# Patient Record
Sex: Female | Born: 1995 | Race: White | Hispanic: No | Marital: Single | Smoking: Current every day smoker
Health system: Southern US, Community
[De-identification: ages and names within clinical notes are randomized; demographics above are authoritative.]

## PROBLEM LIST (undated history)

## (undated) DIAGNOSIS — R519 Headache, unspecified: Secondary | ICD-10-CM

## (undated) DIAGNOSIS — Z789 Other specified health status: Secondary | ICD-10-CM

## (undated) HISTORY — DX: Headache, unspecified: R51.9

## (undated) HISTORY — PX: NO PAST SURGERIES: SHX2092

## (undated) HISTORY — DX: Other specified health status: Z78.9

---

## 2015-08-12 ENCOUNTER — Emergency Department: Payer: Medicaid Other

## 2015-08-12 ENCOUNTER — Encounter: Payer: Self-pay | Admitting: Emergency Medicine

## 2015-08-12 ENCOUNTER — Emergency Department
Admission: EM | Admit: 2015-08-12 | Discharge: 2015-08-12 | Disposition: A | Discharge status: Home / Self Care | Payer: Medicaid Other | Attending: Emergency Medicine | Admitting: Emergency Medicine

## 2015-08-12 DIAGNOSIS — R1010 Upper abdominal pain, unspecified: Secondary | ICD-10-CM | POA: Diagnosis present

## 2015-08-12 DIAGNOSIS — R1012 Left upper quadrant pain: Secondary | ICD-10-CM | POA: Insufficient documentation

## 2015-08-12 DIAGNOSIS — Z3202 Encounter for pregnancy test, result negative: Secondary | ICD-10-CM | POA: Diagnosis not present

## 2015-08-12 DIAGNOSIS — Z720 Tobacco use: Secondary | ICD-10-CM | POA: Diagnosis not present

## 2015-08-12 DIAGNOSIS — R1011 Right upper quadrant pain: Secondary | ICD-10-CM | POA: Insufficient documentation

## 2015-08-12 LAB — CBC
HEMATOCRIT: 34.6 % — AB (ref 35.0–47.0)
Hemoglobin: 11.7 g/dL — ABNORMAL LOW (ref 12.0–16.0)
MCH: 30.7 pg (ref 26.0–34.0)
MCHC: 33.7 g/dL (ref 32.0–36.0)
MCV: 91 fL (ref 80.0–100.0)
Platelets: 348 10*3/uL (ref 150–440)
RBC: 3.81 MIL/uL (ref 3.80–5.20)
RDW: 12.8 % (ref 11.5–14.5)
WBC: 12.2 10*3/uL — ABNORMAL HIGH (ref 3.6–11.0)

## 2015-08-12 LAB — COMPREHENSIVE METABOLIC PANEL
ALBUMIN: 3.2 g/dL — AB (ref 3.5–5.0)
ALK PHOS: 88 U/L (ref 38–126)
ALT: 12 U/L — ABNORMAL LOW (ref 14–54)
AST: 17 U/L (ref 15–41)
Anion gap: 8 (ref 5–15)
BILIRUBIN TOTAL: 0.3 mg/dL (ref 0.3–1.2)
BUN: 9 mg/dL (ref 6–20)
CALCIUM: 8.5 mg/dL — AB (ref 8.9–10.3)
CO2: 29 mmol/L (ref 22–32)
CREATININE: 0.69 mg/dL (ref 0.44–1.00)
Chloride: 101 mmol/L (ref 101–111)
GFR calc Af Amer: >60 mL/min (ref >60)
GFR calc non Af Amer: >60 mL/min (ref >60)
GLUCOSE: 97 mg/dL (ref 65–99)
Potassium: 3.9 mmol/L (ref 3.5–5.1)
Sodium: 138 mmol/L (ref 135–145)
TOTAL PROTEIN: 7 g/dL (ref 6.5–8.1)

## 2015-08-12 LAB — URINALYSIS COMPLETE WITH MICROSCOPIC (ARMC ONLY)
BACTERIA UA: NONE SEEN
BILIRUBIN URINE: NEGATIVE
GLUCOSE, UA: NEGATIVE mg/dL
Ketones, ur: NEGATIVE mg/dL
NITRITE: NEGATIVE
Protein, ur: NEGATIVE mg/dL
Specific Gravity, Urine: 1.017 (ref 1.005–1.030)
pH: 7 (ref 5.0–8.0)

## 2015-08-12 LAB — POCT PREGNANCY, URINE: Preg Test, Ur: NEGATIVE

## 2015-08-12 LAB — LIPASE, BLOOD: Lipase: 19 U/L — ABNORMAL LOW (ref 22–51)

## 2015-08-12 MED ORDER — IBUPROFEN 400 MG PO TABS
600.0000 mg | ORAL_TABLET | Freq: Once | ORAL | Status: AC
  Administered 2015-08-12: 600 mg via ORAL
  Filled 2015-08-12: qty 2

## 2015-08-12 MED ORDER — IBUPROFEN 600 MG PO TABS: 600.0000 mg | ORAL_TABLET | Freq: Three times a day (TID) | ORAL | Status: DC | PRN

## 2015-08-12 MED ORDER — DICYCLOMINE HCL 10 MG PO CAPS
10.0000 mg | ORAL_CAPSULE | Freq: Once | ORAL | Status: AC
  Administered 2015-08-12: 10 mg via ORAL
  Filled 2015-08-12 (×2): qty 1

## 2015-08-12 NOTE — ED Notes (Signed)
Patient transported to Ultrasound 

## 2015-08-12 NOTE — ED Provider Notes (Signed)
[facility] Emergency Department Provider Note  ____________________________________________  Time seen: 1820  I have reviewed the triage vital signs and the nursing notes.   HISTORY  Chief Complaint Abdominal Pain   HPI Sabrina Edwards is a 19 y.o. female who presents complaining of upper abdominal pain. This is been present for 2-3 days. Initially it was mild and not consistent, but now it is more uncomfortable and steady. She denies any nausea, vomiting, or diarrhea. She had expressed to the nurse's upfront that this is primarily in the right upper quadrant. On our interview, she points more to her left upper abdomen. It hurts when she attempts to stand straight up. She has not had symptoms like this before. She has not taken any medication to try to help this improve. She has no known health problems and does not take any medications. She denies any fever. She denies any injury or recent strain.   History reviewed. No pertinent past medical history.  There are no active problems to display for this patient.   History reviewed. No pertinent past surgical history.  Current Outpatient Rx  Name  Route  Sig  Dispense  Refill  . ibuprofen (ADVIL,MOTRIN) 600 MG tablet   Oral   Take 1 tablet (600 mg total) by mouth every 8 (eight) hours as needed for moderate pain.   24 tablet   0     Allergies Review of patient's allergies indicates no known allergies.  No family history on file.  Social History Social History  Substance Use Topics  . Smoking status: Current Every Day Smoker  . Smokeless tobacco: None  . Alcohol Use: No    Review of Systems  Constitutional: Negative for fever. ENT: Negative for sore throat. Cardiovascular: Negative for chest pain. Respiratory: Negative for shortness of breath. Gastrointestinal: Positive for upper abdominal pain. Negative for nausea, vomiting, or diarrhea. See history of present illness Genitourinary: Negative  for dysuria. Musculoskeletal: No myalgias or injuries. Skin: Negative for rash. Neurological: Negative for headaches   10-point ROS otherwise negative.  ____________________________________________   PHYSICAL EXAM:  VITAL SIGNS: ED Triage Vitals  Enc Vitals Group     BP 08/12/15 1723 117/62 mmHg     Pulse Rate 08/12/15 1723 88     Resp 08/12/15 1723 16     Temp 08/12/15 1723 98.4 F (36.9 C)     Temp Source 08/12/15 1723 Oral     SpO2 08/12/15 1723 100 %     Weight 08/12/15 1723 125 lb (56.7 kg)     Height 08/12/15 1723  (1.753 m)     Head Cir --      Peak Flow --      Pain Score 08/12/15 1736 9     Pain Loc --      Pain Edu? --      Excl. in GC? --     Constitutional:  Alert and oriented. Well appearing and in no distress. ENT   Head: Normocephalic and atraumatic.   Nose: No congestion/rhinnorhea.   Mouth/Throat: Mucous membranes are moist. Cardiovascular: Normal rate, regular rhythm, no murmur noted Respiratory:  Normal respiratory effort, no tachypnea.    Breath sounds are clear and equal bilaterally.  Gastrointestinal: Soft, muscular, tenderness more notable in left upper quadrant, mild in right upper quadrant. No discomfort in the lower abdomen. No distention.  Back: No muscle spasm, no CVA tenderness. There is an area of focal tenderness in the right lower posterior thoracic area. Musculoskeletal:  Patient appears a bit uncomfortable when attempting to stand up straight. She has discomfort with movement in general. She has tenderness, as noted above the back exam in her right posterior thoracic area. Her extremities all appear normal with full range of motion and no deformity..  No noted edema. Neurologic:  Normal speech and language. No gross focal neurologic deficits are appreciated.  Skin:  Skin is warm, dry. No rash noted. Psychiatric: Mood and affect are normal. Speech and behavior are normal.  ____________________________________________     LABS (pertinent positives/negatives)  Labs Reviewed  LIPASE, BLOOD - Abnormal; Notable for the following:    Lipase 19 (*)    All other components within normal limits  COMPREHENSIVE METABOLIC PANEL - Abnormal; Notable for the following:    Calcium 8.5 (*)    Albumin 3.2 (*)    ALT 12 (*)    All other components within normal limits  CBC - Abnormal; Notable for the following:    WBC 12.2 (*)    Hemoglobin 11.7 (*)    HCT 34.6 (*)    All other components within normal limits  URINALYSIS COMPLETEWITH MICROSCOPIC ([facility] ONLY) - Abnormal; Notable for the following:    Color, Urine YELLOW (*)    APPearance CLOUDY (*)    Hgb urine dipstick 1+ (*)    Leukocytes, UA 2+ (*)    Squamous Epithelial / LPF 6-30 (*)    All other components within normal limits  POCT PREGNANCY, URINE  POC URINE PREG, ED   ____________________________________________   RADIOLOGY  Ultrasound abdomen: IMPRESSION: Normal right upper quadrant abdominal ultrasound.  KUB  IMPRESSION: Minimally prominent stool in colon.  ____________________________________________  INITIAL IMPRESSION / ASSESSMENT AND PLAN / ED COURSE  Pertinent labs & imaging results that were available during my care of the patient were reviewed by me and considered in my medical decision making (see chart for details).  Well-nourished, well-developed, 19 year old female with upper abdominal pain. The nurse's note mentioned right upper quadrant, but on exam, her left upper quadrant appeared more tender. Despite this, when she stands up, she has increased discomfort and appears to be guarding the right upper quadrant of her abdomen more. She does have some focal tenderness in the right posterior thoracic area area there is a good chance that this discomfort is primarily musculoskeletal. She denies any new exercise activity or strain. We will obtain an ultrasound due to the concerns for upper abdominal pain, right upper quadrant, and  possible biliary disease, although I have low suspicion. We will also obtain a KUB. We've discussed the pros and cons of CT. At this point I do not see an indication for this. We will treat her with Bentyl and ibuprofen for her discomfort.  ----------------------------------------- 8:11 PM on 08/12/2015 -----------------------------------------  Ultrasound is negative. KUB is unremarkable with only minimally prominent stool in colon. Urinalysis does show cloudy urine with 6-30 white blood cells and 2+ leukocyte esterase. She has 6-30 squamous epithelial cells. This is questionable for urinary tract infection. I do not feel a urinary tract infection would explain her symptoms of upper abdominal pain.  Reassessment of the patient at this time finds her more comfortable and when she first arrived. She feels more comfortable sitting up and moving. She is in no acute distress. She is smiling and laughing. I believe this improvement is more likely due to the ibuprofen and the Bentyl she received. I will advise her to continue to take ibuprofen, 600 mg, 3 times a day,  for the next 2 days, and then as needed.  ____________________________________________   FINAL CLINICAL IMPRESSION(S) / ED DIAGNOSES  Final diagnoses:  Pain of upper abdomen      Darien Ramus, MD 08/12/15 2020

## 2015-08-12 NOTE — ED Notes (Signed)
Patient to ER for RUQ abdominal pain for few days. Denies N/V or fevers.

## 2015-08-12 NOTE — Discharge Instructions (Signed)
It is unclear what is causing her upper abdominal pain. We discussed gallbladder, intestines, musculoskeletal, and other causes. Given the improvement that you've had an overall well appearance, we suspect musculoskeletal primarily at this point. You had a negative ultrasound, negative x-ray of her abdomen, an overall negative blood tests. Take ibuprofen, 600 mg, 3 times a day for 2 days, then as needed. Return to the emergency department if your pain worsens or if you have other urgent concerns. Follow-up with your regular doctor or cannot a clinic or another doctor for further evaluation if you have ongoing problems.  Abdominal Pain Many things can cause belly (abdominal) pain. Most times, the belly pain is not dangerous. Many cases of belly pain can be watched and treated at home. HOME CARE   Do not take medicines that help you go poop (laxatives) unless told to by your doctor.  Only take medicine as told by your doctor.  Eat or drink as told by your doctor. Your doctor will tell you if you should be on a special diet. GET HELP IF:  You do not know what is causing your belly pain.  You have belly pain while you are sick to your stomach (nauseous) or have runny poop (diarrhea).  You have pain while you pee or poop.  Your belly pain wakes you up at night.  You have belly pain that gets worse or better when you eat.  You have belly pain that gets worse when you eat fatty foods.  You have a fever. GET HELP RIGHT AWAY IF:   The pain does not go away within 2 hours.  You keep throwing up (vomiting).  The pain changes and is only in the right or left part of the belly.  You have bloody or tarry looking poop. MAKE SURE YOU:   Understand these instructions.  Will watch your condition.  Will get help right away if you are not doing well or get worse. Document Released: 04/27/2008 Document Revised: 11/14/2013 Document Reviewed: 07/19/2013 [facility] Patient Information 2015  [place], [place]. This information is not intended to replace advice given to you by your health care provider. Make sure you discuss any questions you have with your health care provider.

## 2015-10-14 ENCOUNTER — Encounter: Payer: Self-pay | Admitting: Medical Oncology

## 2015-10-14 ENCOUNTER — Emergency Department
Admission: EM | Admit: 2015-10-14 | Discharge: 2015-10-14 | Disposition: A | Discharge status: Home / Self Care | Payer: Medicaid Other | Attending: Emergency Medicine | Admitting: Emergency Medicine

## 2015-10-14 ENCOUNTER — Emergency Department: Payer: Medicaid Other

## 2015-10-14 DIAGNOSIS — Y92410 Unspecified street and highway as the place of occurrence of the external cause: Secondary | ICD-10-CM | POA: Diagnosis not present

## 2015-10-14 DIAGNOSIS — Y9389 Activity, other specified: Secondary | ICD-10-CM | POA: Diagnosis not present

## 2015-10-14 DIAGNOSIS — Y998 Other external cause status: Secondary | ICD-10-CM | POA: Diagnosis not present

## 2015-10-14 DIAGNOSIS — S39012A Strain of muscle, fascia and tendon of lower back, initial encounter: Secondary | ICD-10-CM | POA: Diagnosis not present

## 2015-10-14 DIAGNOSIS — Z3202 Encounter for pregnancy test, result negative: Secondary | ICD-10-CM | POA: Insufficient documentation

## 2015-10-14 DIAGNOSIS — F17200 Nicotine dependence, unspecified, uncomplicated: Secondary | ICD-10-CM | POA: Insufficient documentation

## 2015-10-14 DIAGNOSIS — S3992XA Unspecified injury of lower back, initial encounter: Secondary | ICD-10-CM | POA: Diagnosis present

## 2015-10-14 LAB — POCT PREGNANCY, URINE: PREG TEST UR: NEGATIVE

## 2015-10-14 MED ORDER — IBUPROFEN 800 MG PO TABS
800.0000 mg | ORAL_TABLET | Freq: Once | ORAL | Status: AC
  Administered 2015-10-14: 800 mg via ORAL
  Filled 2015-10-14: qty 1

## 2015-10-14 MED ORDER — DIAZEPAM 5 MG PO TABS: 5.0000 mg | ORAL_TABLET | Freq: Three times a day (TID) | ORAL | Status: DC | PRN

## 2015-10-14 MED ORDER — IBUPROFEN 800 MG PO TABS: 800.0000 mg | ORAL_TABLET | Freq: Three times a day (TID) | ORAL | Status: DC | PRN
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
## 2015-10-14 NOTE — ED Notes (Signed)
Pt informed to return if any life threatening symptoms occur.  

## 2015-10-14 NOTE — Discharge Instructions (Signed)
Back Exercises °The following exercises strengthen the muscles that help to support the back. They also help to keep the lower back flexible. Doing these exercises can help to prevent back pain or lessen existing pain. °If you have back pain or discomfort, try doing these exercises 2-3 times each day or as told by your health care provider. When the pain goes away, do them once each day, but increase the number of times that you repeat the steps for each exercise (do more repetitions). If you do not have back pain or discomfort, do these exercises once each day or as told by your health care provider. °EXERCISES °Single Knee to Chest °Repeat these steps 3-5 times for each leg: °· Lie on your back on a firm bed or the floor with your legs extended. °· Bring one knee to your chest. Your other leg should stay extended and in contact with the floor. °· Hold your knee in place by grabbing your knee or thigh. °· Pull on your knee until you feel a gentle stretch in your lower back. °· Hold the stretch for 10-30 seconds. °· Slowly release and straighten your leg. °Pelvic Tilt °Repeat these steps 5-10 times: °· Lie on your back on a firm bed or the floor with your legs extended. °· Bend your knees so they are pointing toward the ceiling and your feet are flat on the floor. °· Tighten your lower abdominal muscles to press your lower back against the floor. This motion will tilt your pelvis so your tailbone points up toward the ceiling instead of pointing to your feet or the floor. °· With gentle tension and even breathing, hold this position for 5-10 seconds. °Cat-Cow °Repeat these steps until your lower back becomes more flexible: °· Get into a hands-and-knees position on a firm surface. Keep your hands under your shoulders, and keep your knees under your hips. You may place padding under your knees for comfort. °· Let your head hang down, and point your tailbone toward the floor so your lower back becomes rounded like the  back of a cat. °· Hold this position for 5 seconds. °· Slowly lift your head and point your tailbone up toward the ceiling so your back forms a sagging arch like the back of a cow. °· Hold this position for 5 seconds. °Press-Ups °Repeat these steps 5-10 times: °· Lie on your abdomen (face-down) on the floor. °· Place your palms near your head, about shoulder-width apart. °· While you keep your back as relaxed as possible and keep your hips on the floor, slowly straighten your arms to raise the top half of your body and lift your shoulders. Do not use your back muscles to raise your upper torso. You may adjust the placement of your hands to make yourself more comfortable. °· Hold this position for 5 seconds while you keep your back relaxed. °· Slowly return to lying flat on the floor. °Bridges °Repeat these steps 10 times: °· Lie on your back on a firm surface. °· Bend your knees so they are pointing toward the ceiling and your feet are flat on the floor. °· Tighten your buttocks muscles and lift your buttocks off of the floor until your waist is at almost the same height as your knees. You should feel the muscles working in your buttocks and the back of your thighs. If you do not feel these muscles, slide your feet 1-2 inches farther away from your buttocks. °· Hold this position for 3-5   seconds.  Slowly lower your hips to the starting position, and allow your buttocks muscles to relax completely. If this exercise is too easy, try doing it with your arms crossed over your chest. Abdominal Crunches Repeat these steps 5-10 times:  Lie on your back on a firm bed or the floor with your legs extended.  Bend your knees so they are pointing toward the ceiling and your feet are flat on the floor.  Cross your arms over your chest.  Tip your chin slightly toward your chest without bending your neck.  Tighten your abdominal muscles and slowly raise your trunk (torso) high enough to lift your shoulder blades a  tiny bit off of the floor. Avoid raising your torso higher than that, because it can put too much stress on your low back and it does not help to strengthen your abdominal muscles.  Slowly return to your starting position. Back Lifts Repeat these steps 5-10 times:  Lie on your abdomen (face-down) with your arms at your sides, and rest your forehead on the floor.  Tighten the muscles in your legs and your buttocks.  Slowly lift your chest off of the floor while you keep your hips pressed to the floor. Keep the back of your head in line with the curve in your back. Your eyes should be looking at the floor.  Hold this position for 3-5 seconds.  Slowly return to your starting position. SEEK MEDICAL CARE IF:  Your back pain or discomfort gets much worse when you do an exercise.  Your back pain or discomfort does not lessen within 2 hours after you exercise. If you have any of these problems, stop doing these exercises right away. Do not do them again unless your health care provider says that you can. SEEK IMMEDIATE MEDICAL CARE IF:  You develop sudden, severe back pain. If this happens, stop doing the exercises right away. Do not do them again unless your health care provider says that you can.   This information is not intended to replace advice given to you by your health care provider. Make sure you discuss any questions you have with your health care provider.   Document Released: 12/17/2004 Document Revised: 07/31/2015 Document Reviewed: 01/03/2015 [employer] Interactive Patient Education 2016 [employer]  Low Back Sprain With Rehab A sprain is an injury in which a ligament is torn. The ligaments of the lower back are vulnerable to sprains. However, they are strong and require great force to be injured. These ligaments are important for stabilizing the spinal column. Sprains are classified into three categories. Grade 1 sprains cause pain, but the tendon is not lengthened. Grade 2  sprains include a lengthened ligament, due to the ligament being stretched or partially ruptured. With grade 2 sprains there is still function, although the function may be decreased. Grade 3 sprains involve a complete tear of the tendon or muscle, and function is usually impaired. SYMPTOMS   Severe pain in the lower back.  Sometimes, a feeling of a "pop," "snap," or tear, at the time of injury.  Tenderness and sometimes swelling at the injury site.  Uncommonly, bruising (contusion) within 48 hours of injury.  Muscle spasms in the back. CAUSES  Low back sprains occur when a force is placed on the ligaments that is greater than they can handle. Common causes of injury include:  Performing a stressful act while off-balance.  Repetitive stressful activities that involve movement of the lower back.  Direct hit (trauma) to the  lower back. RISK INCREASES WITH:  Contact sports (football, wrestling).  Collisions (major skiing accidents).  Sports that require throwing or lifting (baseball, weightlifting).  Sports involving twisting of the spine (gymnastics, diving, tennis, golf).  Poor strength and flexibility.  Inadequate protection.  Previous back injury or surgery (especially fusion). PREVENTION  Wear properly fitted and padded protective equipment.  Warm up and stretch properly before activity.  Allow for adequate recovery between workouts.  Maintain physical fitness:  Strength, flexibility, and endurance.  Cardiovascular fitness.  Maintain a healthy body weight. PROGNOSIS  If treated properly, low back sprains usually heal with non-surgical treatment. The length of time for healing depends on the severity of the injury.  RELATED COMPLICATIONS   Recurring symptoms, resulting in a chronic problem.  Chronic inflammation and pain in the low back.  Delayed healing or resolution of symptoms, especially if activity is resumed too soon.  Prolonged  impairment.  Unstable or arthritic joints of the low back. TREATMENT  Treatment first involves the use of ice and medicine, to reduce pain and inflammation. The use of strengthening and stretching exercises may help reduce pain with activity. These exercises may be performed at home or with a therapist. Severe injuries may require referral to a therapist for further evaluation and treatment, such as ultrasound. Your caregiver may advise that you wear a back brace or corset, to help reduce pain and discomfort. Often, prolonged bed rest results in greater harm then benefit. Corticosteroid injections may be recommended. However, these should be reserved for the most serious cases. It is important to avoid using your back when lifting objects. At night, sleep on your back on a firm mattress, with a pillow placed under your knees. If non-surgical treatment is unsuccessful, surgery may be needed.  MEDICATION   If pain medicine is needed, nonsteroidal anti-inflammatory medicines (aspirin and ibuprofen), or other minor pain relievers (acetaminophen), are often advised.  Do not take pain medicine for 7 days before surgery.  Prescription pain relievers may be given, if your caregiver thinks they are needed. Use only as directed and only as much as you need.  Ointments applied to the skin may be helpful.  Corticosteroid injections may be given by your caregiver. These injections should be reserved for the most serious cases, because they may only be given a certain number of times. HEAT AND COLD  Cold treatment (icing) should be applied for 10 to 15 minutes every 2 to 3 hours for inflammation and pain, and immediately after activity that aggravates your symptoms. Use ice packs or an ice massage.  Heat treatment may be used before performing stretching and strengthening activities prescribed by your caregiver, physical therapist, or athletic trainer. Use a heat pack or a warm water soak. SEEK MEDICAL CARE  IF:   Symptoms get worse or do not improve in 2 to 4 weeks, despite treatment.  You develop numbness or weakness in either leg.  You lose bowel or bladder function.  Any of the following occur after surgery: fever, increased pain, swelling, redness, drainage of fluids, or bleeding in the affected area.  New, unexplained symptoms develop. (Drugs used in treatment may produce side effects.) EXERCISES  RANGE OF MOTION (ROM) AND STRETCHING EXERCISES - Low Back Sprain Most people with lower back pain will find that their symptoms get worse with excessive bending forward (flexion) or arching at the lower back (extension). The exercises that will help resolve your symptoms will focus on the opposite motion.  Your physician, physical therapist  or athletic trainer will help you determine which exercises will be most helpful to resolve your lower back pain. Do not complete any exercises without first consulting with your caregiver. Discontinue any exercises which make your symptoms worse, until you speak to your caregiver. If you have pain, numbness or tingling which travels down into your buttocks, leg or foot, the goal of the therapy is for these symptoms to move closer to your back and eventually resolve. Sometimes, these leg symptoms will get better, but your lower back pain may worsen. This is often an indication of progress in your rehabilitation. Be very alert to any changes in your symptoms and the activities in which you participated in the 24 hours prior to the change. Sharing this information with your caregiver will allow him or her to most efficiently treat your condition. These exercises may help you when beginning to rehabilitate your injury. Your symptoms may resolve with or without further involvement from your physician, physical therapist or athletic trainer. While completing these exercises, remember:   Restoring tissue flexibility helps normal motion to return to the joints. This allows  healthier, less painful movement and activity.  An effective stretch should be held for at least 30 seconds.  A stretch should never be painful. You should only feel a gentle lengthening or release in the stretched tissue. FLEXION RANGE OF MOTION AND STRETCHING EXERCISES: STRETCH - Flexion, Single Knee to Chest   Lie on a firm bed or floor with both legs extended in front of you.  Keeping one leg in contact with the floor, bring your opposite knee to your chest. Hold your leg in place by either grabbing behind your thigh or at your knee.  Pull until you feel a gentle stretch in your low back. Hold __________ seconds.  Slowly release your grasp and repeat the exercise with the opposite side. Repeat __________ times. Complete this exercise __________ times per day.  STRETCH - Flexion, Double Knee to Chest  Lie on a firm bed or floor with both legs extended in front of you.  Keeping one leg in contact with the floor, bring your opposite knee to your chest.  Tense your stomach muscles to support your back and then lift your other knee to your chest. Hold your legs in place by either grabbing behind your thighs or at your knees.  Pull both knees toward your chest until you feel a gentle stretch in your low back. Hold __________ seconds.  Tense your stomach muscles and slowly return one leg at a time to the floor. Repeat __________ times. Complete this exercise __________ times per day.  STRETCH - Low Trunk Rotation  Lie on a firm bed or floor. Keeping your legs in front of you, bend your knees so they are both pointed toward the ceiling and your feet are flat on the floor.  Extend your arms out to the side. This will stabilize your upper body by keeping your shoulders in contact with the floor.  Gently and slowly drop both knees together to one side until you feel a gentle stretch in your low back. Hold for __________ seconds.  Tense your stomach muscles to support your lower back as  you bring your knees back to the starting position. Repeat the exercise to the other side. Repeat __________ times. Complete this exercise __________ times per day  EXTENSION RANGE OF MOTION AND FLEXIBILITY EXERCISES: STRETCH - Extension, Prone on Elbows   Lie on your stomach on the floor, a bed  will be too soft. Place your palms about shoulder width apart and at the height of your head.  Place your elbows under your shoulders. If this is too painful, stack pillows under your chest.  Allow your body to relax so that your hips drop lower and make contact more completely with the floor.  Hold this position for __________ seconds.  Slowly return to lying flat on the floor. Repeat __________ times. Complete this exercise __________ times per day.  RANGE OF MOTION - Extension, Prone Press Ups  Lie on your stomach on the floor, a bed will be too soft. Place your palms about shoulder width apart and at the height of your head.  Keeping your back as relaxed as possible, slowly straighten your elbows while keeping your hips on the floor. You may adjust the placement of your hands to maximize your comfort. As you gain motion, your hands will come more underneath your shoulders.  Hold this position __________ seconds.  Slowly return to lying flat on the floor. Repeat __________ times. Complete this exercise __________ times per day.  RANGE OF MOTION- Quadruped, Neutral Spine   Assume a hands and knees position on a firm surface. Keep your hands under your shoulders and your knees under your hips. You may place padding under your knees for comfort.  Drop your head and point your tailbone toward the ground below you. This will round out your lower back like an angry cat. Hold this position for __________ seconds.  Slowly lift your head and release your tail bone so that your back sags into a large arch, like an old horse.  Hold this position for __________ seconds.  Repeat this until you feel  limber in your low back.  Now, find your "sweet spot." This will be the most comfortable position somewhere between the two previous positions. This is your neutral spine. Once you have found this position, tense your stomach muscles to support your low back.  Hold this position for __________ seconds. Repeat __________ times. Complete this exercise __________ times per day.  STRENGTHENING EXERCISES - Low Back Sprain These exercises may help you when beginning to rehabilitate your injury. These exercises should be done near your "sweet spot." This is the neutral, low-back arch, somewhere between fully rounded and fully arched, that is your least painful position. When performed in this safe range of motion, these exercises can be used for people who have either a flexion or extension based injury. These exercises may resolve your symptoms with or without further involvement from your physician, physical therapist or athletic trainer. While completing these exercises, remember:   Muscles can gain both the endurance and the strength needed for everyday activities through controlled exercises.  Complete these exercises as instructed by your physician, physical therapist or athletic trainer. Increase the resistance and repetitions only as guided.  You may experience muscle soreness or fatigue, but the pain or discomfort you are trying to eliminate should never worsen during these exercises. If this pain does worsen, stop and make certain you are following the directions exactly. If the pain is still present after adjustments, discontinue the exercise until you can discuss the trouble with your caregiver. STRENGTHENING - Deep Abdominals, Pelvic Tilt   Lie on a firm bed or floor. Keeping your legs in front of you, bend your knees so they are both pointed toward the ceiling and your feet are flat on the floor.  Tense your lower abdominal muscles to press your low back into  the floor. This motion will  rotate your pelvis so that your tail bone is scooping upwards rather than pointing at your feet or into the floor. With a gentle tension and even breathing, hold this position for __________ seconds. Repeat __________ times. Complete this exercise __________ times per day.  STRENGTHENING - Abdominals, Crunches   Lie on a firm bed or floor. Keeping your legs in front of you, bend your knees so they are both pointed toward the ceiling and your feet are flat on the floor. Cross your arms over your chest.  Slightly tip your chin down without bending your neck.  Tense your abdominals and slowly lift your trunk high enough to just clear your shoulder blades. Lifting higher can put excessive stress on the lower back and does not further strengthen your abdominal muscles.  Control your return to the starting position. Repeat __________ times. Complete this exercise __________ times per day.  STRENGTHENING - Quadruped, Opposite UE/LE Lift   Assume a hands and knees position on a firm surface. Keep your hands under your shoulders and your knees under your hips. You may place padding under your knees for comfort.  Find your neutral spine and gently tense your abdominal muscles so that you can maintain this position. Your shoulders and hips should form a rectangle that is parallel with the floor and is not twisted.  Keeping your trunk steady, lift your right hand no higher than your shoulder and then your left leg no higher than your hip. Make sure you are not holding your breath. Hold this position for __________ seconds.  Continuing to keep your abdominal muscles tense and your back steady, slowly return to your starting position. Repeat with the opposite arm and leg. Repeat __________ times. Complete this exercise __________ times per day.  STRENGTHENING - Abdominals and Quadriceps, Straight Leg Raise   Lie on a firm bed or floor with both legs extended in front of you.  Keeping one leg in contact  with the floor, bend the other knee so that your foot can rest flat on the floor.  Find your neutral spine, and tense your abdominal muscles to maintain your spinal position throughout the exercise.  Slowly lift your straight leg off the floor about 6 inches for a count of 15, making sure to not hold your breath.  Still keeping your neutral spine, slowly lower your leg all the way to the floor. Repeat this exercise with each leg __________ times. Complete this exercise __________ times per day. POSTURE AND BODY MECHANICS CONSIDERATIONS - Low Back Sprain Keeping correct posture when sitting, standing or completing your activities will reduce the stress put on different body tissues, allowing injured tissues a chance to heal and limiting painful experiences. The following are general guidelines for improved posture. Your physician or physical therapist will provide you with any instructions specific to your needs. While reading these guidelines, remember:  The exercises prescribed by your provider will help you have the flexibility and strength to maintain correct postures.  The correct posture provides the best environment for your joints to work. All of your joints have less wear and tear when properly supported by a spine with good posture. This means you will experience a healthier, less painful body.  Correct posture must be practiced with all of your activities, especially prolonged sitting and standing. Correct posture is as important when doing repetitive low-stress activities (typing) as it is when doing a single heavy-load activity (lifting). RESTING POSITIONS Consider which positions  are most painful for you when choosing a resting position. If you have pain with flexion-based activities (sitting, bending, stooping, squatting), choose a position that allows you to rest in a less flexed posture. You would want to avoid curling into a fetal position on your side. If your pain worsens with  extension-based activities (prolonged standing, working overhead), avoid resting in an extended position such as sleeping on your stomach. Most people will find more comfort when they rest with their spine in a more neutral position, neither too rounded nor too arched. Lying on a non-sagging bed on your side with a pillow between your knees, or on your back with a pillow under your knees will often provide some relief. Keep in mind, being in any one position for a prolonged period of time, no matter how correct your posture, can still lead to stiffness. PROPER SITTING POSTURE In order to minimize stress and discomfort on your spine, you must sit with correct posture. Sitting with good posture should be effortless for a healthy body. Returning to good posture is a gradual process. Many people can work toward this most comfortably by using various supports until they have the flexibility and strength to maintain this posture on their own. When sitting with proper posture, your ears will fall over your shoulders and your shoulders will fall over your hips. You should use the back of the chair to support your upper back. Your lower back will be in a neutral position, just slightly arched. You may place a small pillow or folded towel at the base of your lower back for  support.  When working at a desk, create an environment that supports good, upright posture. Without extra support, muscles tire, which leads to excessive strain on joints and other tissues. Keep these recommendations in mind: CHAIR:  A chair should be able to slide under your desk when your back makes contact with the back of the chair. This allows you to work closely.  The chair's height should allow your eyes to be level with the upper part of your monitor and your hands to be slightly lower than your elbows. BODY POSITION  Your feet should make contact with the floor. If this is not possible, use a foot rest.  Keep your ears over your  shoulders. This will reduce stress on your neck and low back. INCORRECT SITTING POSTURES  If you are feeling tired and unable to assume a healthy sitting posture, do not slouch or slump. This puts excessive strain on your back tissues, causing more damage and pain. Healthier options include:  Using more support, like a lumbar pillow.  Switching tasks to something that requires you to be upright or walking.  Talking a brief walk.  Lying down to rest in a neutral-spine position. PROLONGED STANDING WHILE SLIGHTLY LEANING FORWARD  When completing a task that requires you to lean forward while standing in one place for a long time, place either foot up on a stationary 2-4 inch high object to help maintain the best posture. When both feet are on the ground, the lower back tends to lose its slight inward curve. If this curve flattens (or becomes too large), then the back and your other joints will experience too much stress, tire more quickly, and can cause pain. CORRECT STANDING POSTURES Proper standing posture should be assumed with all daily activities, even if they only take a few moments, like when brushing your teeth. As in sitting, your ears should fall over your  shoulders and your shoulders should fall over your hips. You should keep a slight tension in your abdominal muscles to brace your spine. Your tailbone should point down to the ground, not behind your body, resulting in an over-extended swayback posture.  INCORRECT STANDING POSTURES  Common incorrect standing postures include a forward head, locked knees and/or an excessive swayback. WALKING Walk with an upright posture. Your ears, shoulders and hips should all line-up. PROLONGED ACTIVITY IN A FLEXED POSITION When completing a task that requires you to bend forward at your waist or lean over a low surface, try to find a way to stabilize 3 out of 4 of your limbs. You can place a hand or elbow on your thigh or rest a knee on the surface you  are reaching across. This will provide you more stability, so that your muscles do not tire as quickly. By keeping your knees relaxed, or slightly bent, you will also reduce stress across your lower back. CORRECT LIFTING TECHNIQUES DO :  Assume a wide stance. This will provide you more stability and the opportunity to get as close as possible to the object which you are lifting.  Tense your abdominals to brace your spine. Bend at the knees and hips. Keeping your back locked in a neutral-spine position, lift using your leg muscles. Lift with your legs, keeping your back straight.  Test the weight of unknown objects before attempting to lift them.  Try to keep your elbows locked down at your sides in order get the best strength from your shoulders when carrying an object.  Always ask for help when lifting heavy or awkward objects. INCORRECT LIFTING TECHNIQUES DO NOT:   Lock your knees when lifting, even if it is a small object.  Bend and twist. Pivot at your feet or move your feet when needing to change directions.  Assume that you can safely pick up even a paperclip without proper posture.   This information is not intended to replace advice given to you by your health care provider. Make sure you discuss any questions you have with your health care provider.   Document Released: 11/09/2005 Document Revised: 11/30/2014 Document Reviewed: 02/21/2009 Elsevier Interactive Patient Education [employer]
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
## 2017-08-17 IMAGING — US US ABDOMEN LIMITED
1 series · 14 of 25 positions shown · non-contrast
Comparison: None.

CLINICAL DATA: 18-year-old female with right upper abdominal pain
for 2 days.

EXAM:
US ABDOMEN LIMITED - RIGHT UPPER QUADRANT

[Series 1: us abdomen limited · 0.16mm/px · 14 of 43 slices shown]
[im 1/43]
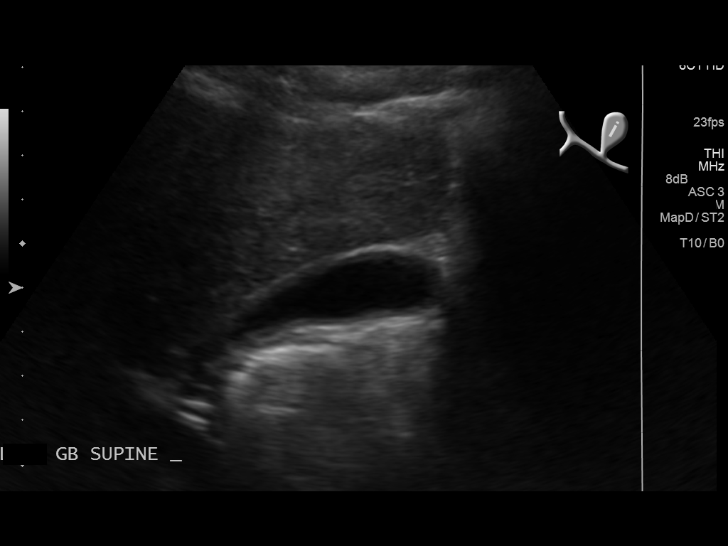
[im 4/43]
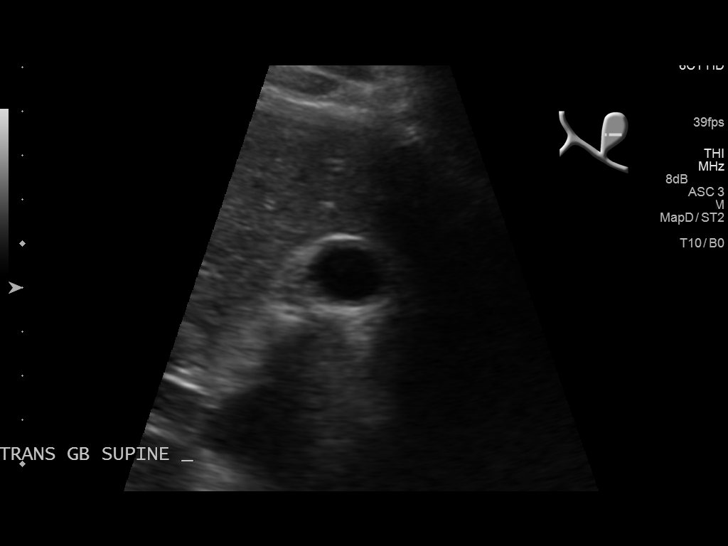
[im 8/43]
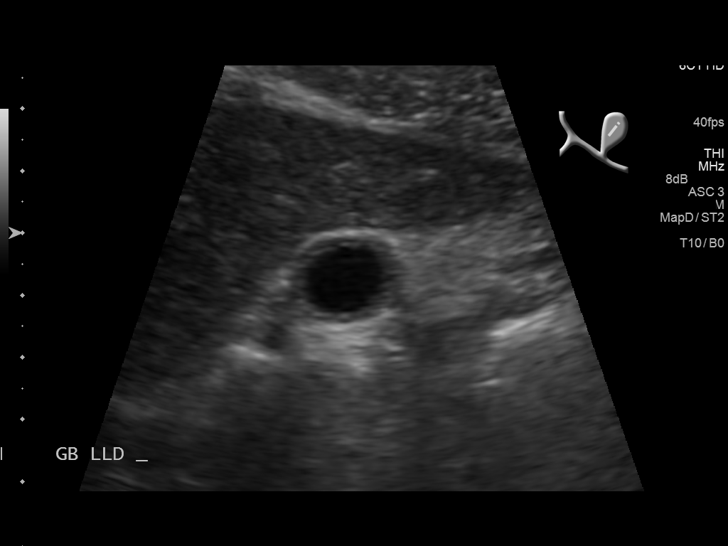
[im 11/43]
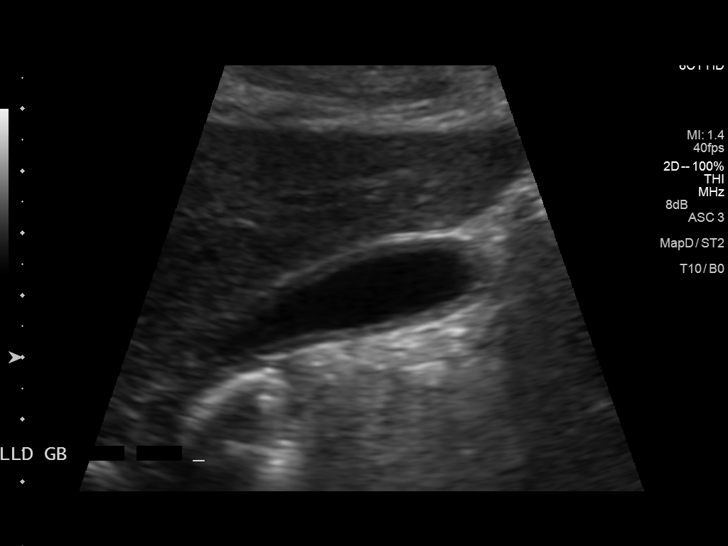
[im 15/43]
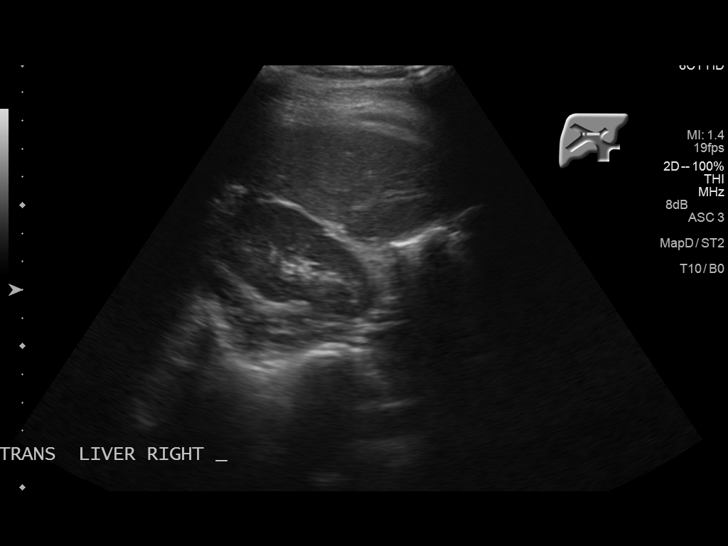
[im 16/43]
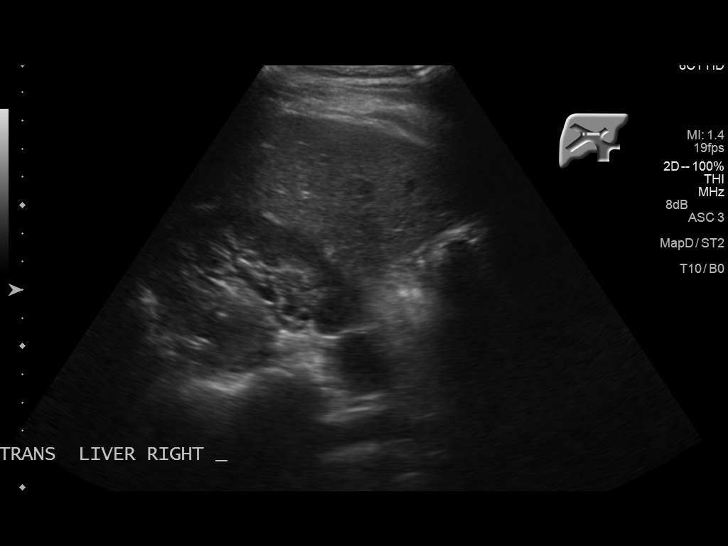
[im 20/43]
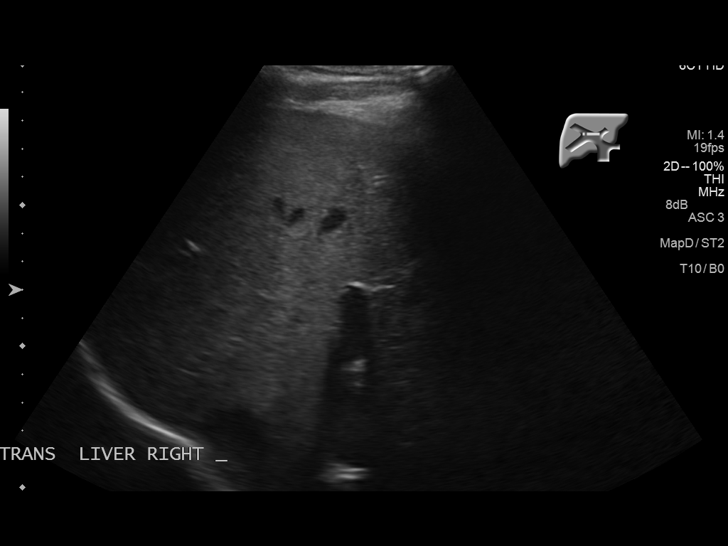
[im 23/43]
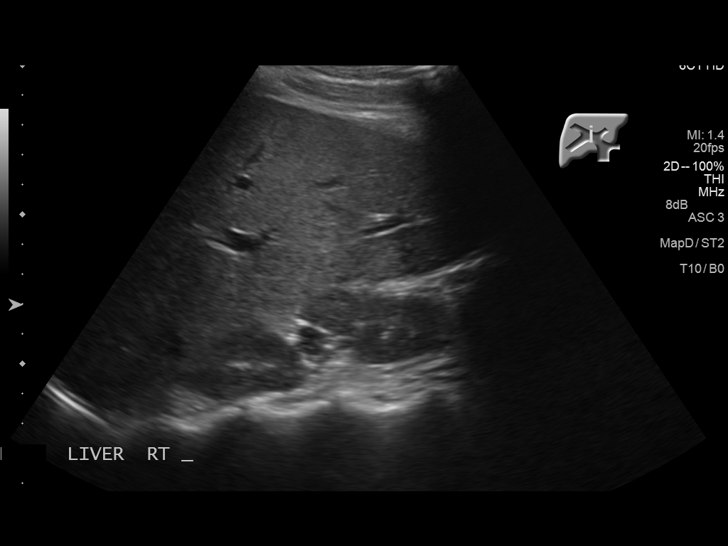
[im 27/43]
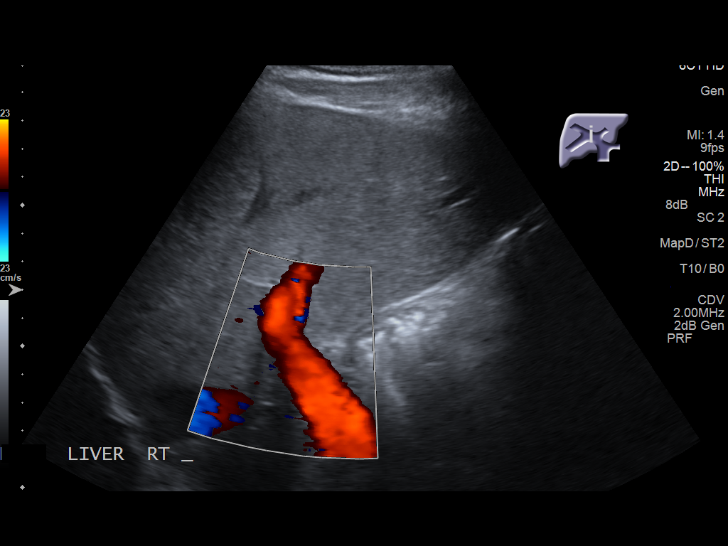
[im 29/43]
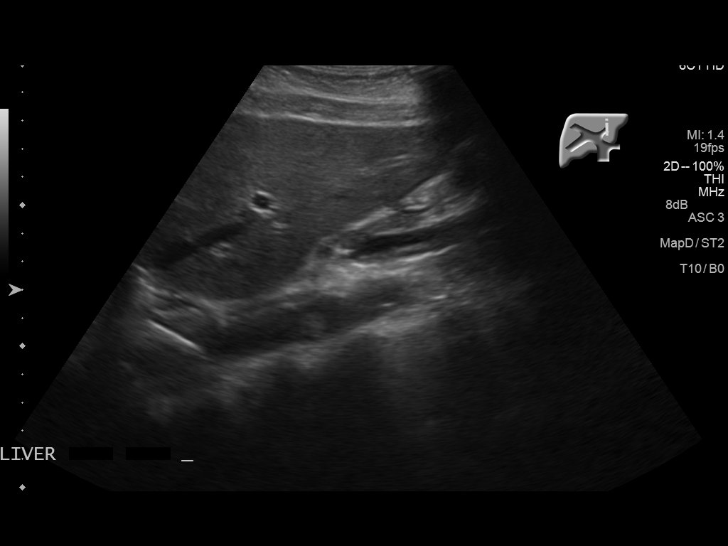
[im 32/43]
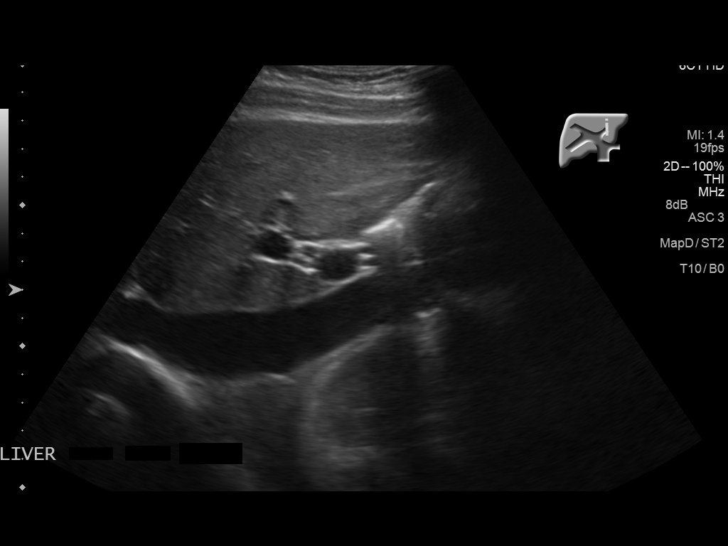
[im 36/43]
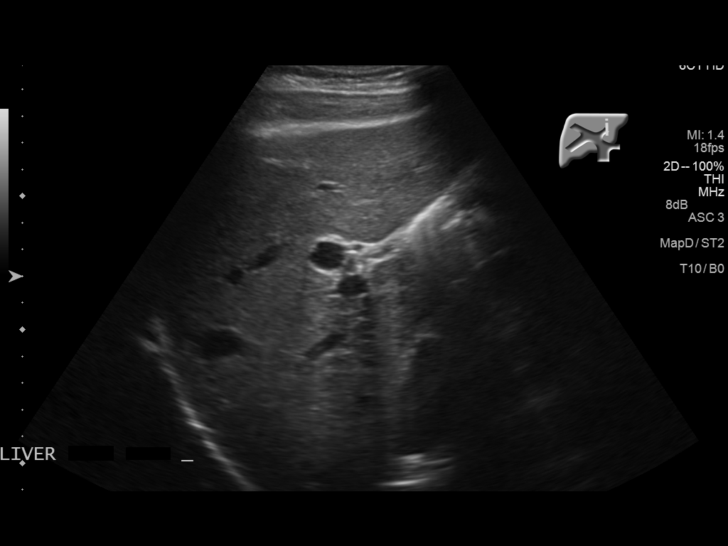
[im 39/43]
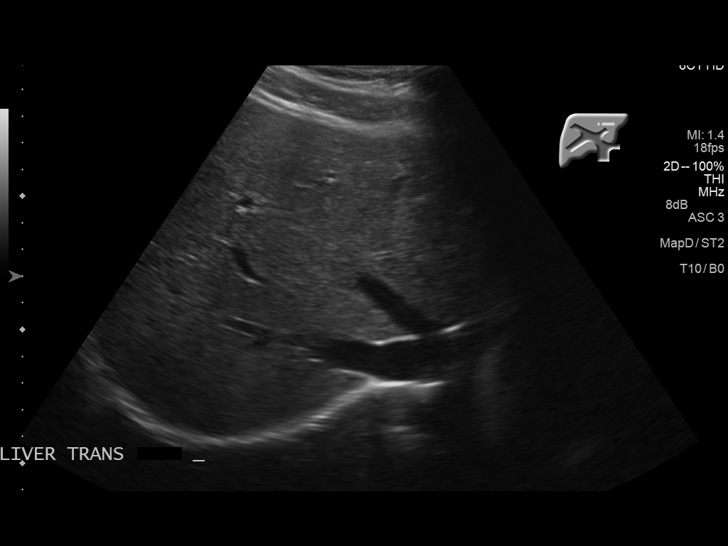
[im 43/43]
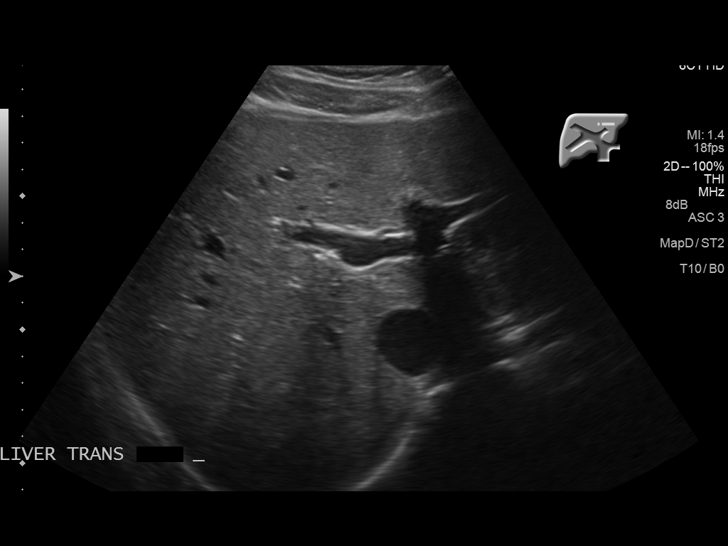

[14 of 25 positions shown; findings below may reference images not displayed]

FINDINGS: Gallbladder:

The gallbladder is unremarkable. There is no evidence of
cholelithiasis or evidence of acute cholecystitis.

Common bile duct:

Diameter: 2.4 mm. There is no evidence of intrahepatic or
extrahepatic biliary dilatation.

Liver:

No focal lesion identified. Within normal limits in parenchymal
echogenicity.

There is no evidence of free fluid within the right upper abdomen.
IMPRESSION: Normal right upper quadrant abdominal ultrasound.

## 2021-11-23 NOTE — L&D Delivery Note (Addendum)
Delivery Note   Sabrina Edwards is a 26 y.o. G2P0010 at [redacted]w[redacted]d Estimated Date of Delivery: 06/25/22  PRE-OPERATIVE DIAGNOSIS:  1) [redacted]w[redacted]d pregnancy.      +UDS  POST-OPERATIVE DIAGNOSIS:  1) [redacted]w[redacted]d pregnancy s/p Vaginal, Spontaneous    Delivery Type: Vaginal, Spontaneous    Delivery Anesthesia: Epidural   Labor Complications:  None    ESTIMATED BLOOD LOSS: 200 ml    FINDINGS:   1) female infant, "Miller," Apgar scores of 8   at 1 minute and 9   at 5 minutes and a birthweight of 6 lbs., 8 oz.   SPECIMENS:   PLACENTA:   Appearance: Intact , circumvallate    Removal: Spontaneous      Disposition:  Per protocol  CORD BLOOD: Not Indicated  DISPOSITION:  Infant left in stable condition in the delivery room, with L&D personnel and mother,  NARRATIVE SUMMARY: Labor course:  Sabrina Edwards is a G2P0010 at [redacted]w[redacted]d who presented to Labor & Delivery for labor management. Her initial cervical exam was 4/80/-2. Labor proceeded spontaneously and she was found to be completely dilated at 0106. With excellent maternal pushing effort, she birthed a viable female infant at 42. The shoulders were birthed without difficulty. The infant was placed skin-to-skin with Sabrina Edwards. The cord was doubly clamped and cut by the father when pulsations ceased. The placenta delivered spontaneously and was noted to be intact with a 3VC. A perineal and vaginal examination was performed. Episiotomy/Lacerations: Labial  Lacerations were repaired with Vicryl suture using local and epidural anesthesia. The patient tolerated this well. Mother and baby were left in stable condition.   Sabrina Edwards, CNM 06/10/2022 2:57 AM

## 2021-12-03 ENCOUNTER — Other Ambulatory Visit: Payer: Self-pay

## 2021-12-03 ENCOUNTER — Ambulatory Visit (LOCAL_COMMUNITY_HEALTH_CENTER): Payer: Self-pay

## 2021-12-03 VITALS — BP 120/71 | Ht 69.0 in | Wt 151.0 lb

## 2021-12-03 DIAGNOSIS — Z3201 Encounter for pregnancy test, result positive: Secondary | ICD-10-CM

## 2021-12-03 LAB — PREGNANCY, URINE: Preg Test, Ur: POSITIVE — AB

## 2021-12-03 MED ORDER — PRENATAL 27-0.8 MG PO TABS: 1.0000 | ORAL_TABLET | Freq: Every day | ORAL | 0 refills | Status: AC

## 2021-12-03 NOTE — Progress Notes (Signed)
UPT positive. Plans prenatal care at [facility] and has appt scheduled 12/25/2021. To DSS for medicaid/preg women. Jerel Shepherd, RN

## 2021-12-25 ENCOUNTER — Ambulatory Visit (INDEPENDENT_AMBULATORY_CARE_PROVIDER_SITE_OTHER): Payer: Self-pay | Admitting: Obstetrics

## 2021-12-25 ENCOUNTER — Encounter: Payer: Self-pay | Admitting: Obstetrics

## 2021-12-25 ENCOUNTER — Other Ambulatory Visit: Payer: Self-pay

## 2021-12-25 ENCOUNTER — Encounter: Payer: Self-pay | Admitting: Obstetrics and Gynecology

## 2021-12-25 VITALS — BP 100/69 | HR 128 | Ht 69.0 in | Wt 149.6 lb

## 2021-12-25 DIAGNOSIS — Z0283 Encounter for blood-alcohol and blood-drug test: Secondary | ICD-10-CM

## 2021-12-25 DIAGNOSIS — Z3A17 17 weeks gestation of pregnancy: Secondary | ICD-10-CM

## 2021-12-25 DIAGNOSIS — Z3492 Encounter for supervision of normal pregnancy, unspecified, second trimester: Secondary | ICD-10-CM

## 2021-12-25 DIAGNOSIS — Z113 Encounter for screening for infections with a predominantly sexual mode of transmission: Secondary | ICD-10-CM

## 2021-12-25 NOTE — Progress Notes (Signed)
NEW OB HISTORY AND PHYSICAL  SUBJECTIVE:       Sabrina Edwards is a 26 y.o. G65P0010 female, Patient's last menstrual period was 08/23/2021 (within weeks)., Estimated Date of Delivery: 05/30/22, [redacted]w[redacted]d, presents today for establishment of Prenatal Care. She is unsure of her LMP but believes it was in October. She has not had an [place] yet. She reports fatigue and breast tenderness, but otherwise feels well.   Social history Partner/Relationship: FOB involved Living situation: Lives with mother Work: Looking for work as Educational psychologist Exercise: none Substance use: denies tobacco, vape, EtOH, and recreational drug use   Gynecologic History Patient's last menstrual period was 08/23/2021 (within weeks). Normal Contraception: none Last Pap: 2 years ago. Results normal per pt  Obstetric History OB History  Gravida Para Term Preterm AB Living  2 0 0 0 1 0  SAB IAB Ectopic Multiple Live Births  0 1 0 0 0    # Outcome Date GA Lbr Len/2nd Weight Sex Delivery Anes PTL Lv  2 Current           1 IAB 2016            Past Medical History:  Diagnosis Date   Patient denies medical problems     Past Surgical History:  Procedure Laterality Date   NO PAST SURGERIES      Current Outpatient Medications on File Prior to Visit  Medication Sig Dispense Refill   Prenatal Vit-Fe Fumarate-FA (MULTIVITAMIN-PRENATAL) 27-0.8 MG TABS tablet Take 1 tablet by mouth daily at 12 noon. 100 tablet 0   No current facility-administered medications on file prior to visit.    No Known Allergies  Social History   Socioeconomic History   Marital status: Single    Spouse name: Not on file   Number of children: Not on file   Years of education: Not on file   Highest education level: Not on file  Occupational History   Not on file  Tobacco Use   Smoking status: Former    Types: Cigarettes   Smokeless tobacco: Never   Tobacco comments:    12/03/21 smokes 6-7 cig/d, trying to cut down since preg  Vaping Use    Vaping Use: Never used  Substance and Sexual Activity   Alcohol use: Not Currently    Comment: rarely, last use one year ago (approx 11/2020)   Drug use: Not Currently    Types: Marijuana    Comment: last mj use - 4 months ago   Sexual activity: Yes    Comment: used condoms years ago  Other Topics Concern   Not on file  Social History Narrative   Not on file   Social Determinants of Health   Financial Resource Strain: Not on file  Food Insecurity: Not on file  Transportation Needs: Not on file  Physical Activity: Not on file  Stress: Not on file  Social Connections: Not on file  Intimate Partner Violence: Not At Risk   Fear of Current or Ex-Partner: No   Emotionally Abused: No   Physically Abused: No   Sexually Abused: No    History reviewed. No pertinent family history.  The following portions of the patient's history were reviewed and updated as appropriate: allergies, current medications, past OB history, past medical history, past surgical history, past family history, past social history, and problem list.  History obtained from the patient General ROS: negative for - chills, fatigue, or night sweats Psychological ROS: negative for - anxiety or depression ENT ROS:  negative for - headaches, sinus pain, sore throat, or visual changes Endocrine ROS: negative for - breast changes, hot flashes, palpitations, polydipsia/polyuria, or skin changes Breast ROS: negative for breast lumps Respiratory ROS: no cough, shortness of breath, or wheezing Cardiovascular ROS: no chest pain or dyspnea on exertion Gastrointestinal ROS: no abdominal pain, change in bowel habits, or black or bloody stools Genito-Urinary ROS: no dysuria, trouble voiding, or hematuria Musculoskeletal ROS: negative for - joint pain or muscle pain Dermatological ROS: negative for mole changes and rash   OBJECTIVE: Initial Physical Exam (New OB)  GENERAL APPEARANCE: alert, well appearing HEAD: normocephalic,  atraumatic MOUTH: mucous membranes moist, pharynx normal without lesions THYROID: no thyromegaly or masses present BREASTS: no masses noted, no significant tenderness, no palpable axillary nodes, no skin changes, healing scab on left breast; left nipple inverted LUNGS: clear to auscultation, no wheezes, rales or rhonchi, symmetric air entry HEART: regular rate and rhythm, no murmurs ABDOMEN: soft, nontender, nondistended, no abnormal masses, no epigastric pain, FHT present 145 bpm, fundus soft, non-tender, midway to umbilicus EXTREMITIES: no edema SKIN: normal coloration and turgor, no rashes LYMPH NODES: no adenopathy palpable NEUROLOGIC: alert, oriented, normal speech, no focal findings or movement disorder noted PELVIC EXAM declined  ASSESSMENT: Normal pregnancy Unsure dates  PLAN: Routine prenatal care. We discussed an overview of midwifery care and MD care, and Chaya Jan would like to proceed with midwifery care. Reviewed when to call. Reviewed diet and exercise recommendations in pregnancy. Discussed benefits of breastfeeding and lactation resources at [facility]. Prenatal education completed by CMA; packet given. NOB labs and MaterniT 21 screening drawn today. [place] for dating and anatomy ordered.  See orders  Lloyd Huger, CNM

## 2021-12-26 LAB — CBC
Hematocrit: 37.9 % (ref 34.0–46.6)
Hemoglobin: 12.8 g/dL (ref 11.1–15.9)
MCH: 31.6 pg (ref 26.6–33.0)
MCHC: 33.8 g/dL (ref 31.5–35.7)
MCV: 94 fL (ref 79–97)
Platelets: 265 10*3/uL (ref 150–450)
RBC: 4.05 x10E6/uL (ref 3.77–5.28)
RDW: 12.8 % (ref 11.7–15.4)
WBC: 7.9 10*3/uL (ref 3.4–10.8)

## 2021-12-26 LAB — VIRAL HEPATITIS HBV, HCV
HCV Ab: <0.1 s/co ratio (ref 0.0–0.9)
Hep B Core Total Ab: NEGATIVE
Hep B Surface Ab, Qual: NONREACTIVE
Hepatitis B Surface Ag: NEGATIVE

## 2021-12-26 LAB — URINALYSIS, ROUTINE W REFLEX MICROSCOPIC
Bilirubin, UA: NEGATIVE
Glucose, UA: NEGATIVE
Ketones, UA: NEGATIVE
Leukocytes,UA: NEGATIVE
Nitrite, UA: NEGATIVE
Protein,UA: NEGATIVE
RBC, UA: NEGATIVE
Specific Gravity, UA: 1.019 (ref 1.005–1.030)
Urobilinogen, Ur: 0.2 mg/dL (ref 0.2–1.0)
pH, UA: 7.5 (ref 5.0–7.5)

## 2021-12-26 LAB — TOXOPLASMA ANTIBODIES- IGG AND  IGM
Toxoplasma Antibody- IgM: <3 AU/mL (ref 0.0–7.9)
Toxoplasma IgG Ratio: 3.4 IU/mL (ref 0.0–7.1)

## 2021-12-26 LAB — RPR: RPR Ser Ql: NONREACTIVE

## 2021-12-26 LAB — ABO AND RH: Rh Factor: POSITIVE

## 2021-12-26 LAB — HCV INTERPRETATION

## 2021-12-26 LAB — HIV ANTIBODY (ROUTINE TESTING W REFLEX): HIV Screen 4th Generation wRfx: NONREACTIVE

## 2021-12-26 LAB — ANTIBODY SCREEN: Antibody Screen: NEGATIVE

## 2021-12-26 LAB — RUBELLA SCREEN: Rubella Antibodies, IGG: 2.38 index (ref >0.99)

## 2021-12-26 LAB — VARICELLA ZOSTER ANTIBODY, IGG: Varicella zoster IgG: 422 index (ref >165)

## 2021-12-27 LAB — GC/CHLAMYDIA PROBE AMP
Chlamydia trachomatis, NAA: NEGATIVE
Neisseria Gonorrhoeae by PCR: NEGATIVE

## 2021-12-27 LAB — URINE CULTURE, OB REFLEX

## 2021-12-27 LAB — CULTURE, OB URINE

## 2021-12-29 LAB — MATERNIT21  PLUS CORE+ESS+SCA, BLOOD

## 2021-12-31 ENCOUNTER — Other Ambulatory Visit: Payer: Self-pay | Admitting: Obstetrics

## 2021-12-31 ENCOUNTER — Encounter: Payer: Self-pay | Admitting: Obstetrics

## 2021-12-31 ENCOUNTER — Other Ambulatory Visit: Payer: Self-pay

## 2021-12-31 ENCOUNTER — Ambulatory Visit (INDEPENDENT_AMBULATORY_CARE_PROVIDER_SITE_OTHER): Payer: Self-pay

## 2021-12-31 DIAGNOSIS — Z3A17 17 weeks gestation of pregnancy: Secondary | ICD-10-CM

## 2022-01-01 LAB — CANNABINOID (GC/MS), URINE
Cannabinoid: POSITIVE — AB
Carboxy THC (GC/MS): 310 ng/mL

## 2022-01-01 LAB — MONITOR DRUG PROFILE 14(MW)
Amphetamine Scrn, Ur: NEGATIVE ng/mL
BARBITURATE SCREEN URINE: NEGATIVE ng/mL
BENZODIAZEPINE SCREEN, URINE: NEGATIVE ng/mL
Buprenorphine, Urine: NEGATIVE ng/mL
Cocaine (Metab) Scrn, Ur: NEGATIVE ng/mL
Creatinine(Crt), U: 43.5 mg/dL (ref 20.0–300.0)
Meperidine Screen, Urine: NEGATIVE ng/mL
Methadone Screen, Urine: NEGATIVE ng/mL
OXYCODONE+OXYMORPHONE UR QL SCN: NEGATIVE ng/mL
Opiate Scrn, Ur: NEGATIVE ng/mL
Ph of Urine: 8.1 (ref 4.5–8.9)
Phencyclidine Qn, Ur: NEGATIVE ng/mL
Propoxyphene Scrn, Ur: NEGATIVE ng/mL
SPECIFIC GRAVITY: 1.009
Tramadol Screen, Urine: NEGATIVE ng/mL

## 2022-01-01 LAB — FENTANYL/NORFENTANYL, CONFIRM
Fentanyl Ur Cfm-Mcnc: 1798 pg/mL
Fentanyl Ur Ql Cfm: POSITIVE — AB
Fentanyl+Norfentanyl Qr Ql Cfm: POSITIVE — AB
Norfentanyl Ur Cfm-Mcnc: >90000 pg/mL
Norfentanyl Ur Ql Cfm: POSITIVE — AB

## 2022-01-01 LAB — NICOTINE SCREEN, URINE: Cotinine Ql Scrn, Ur: POSITIVE ng/mL — AB

## 2022-02-02 ENCOUNTER — Encounter: Payer: Medicaid Other | Admitting: Certified Nurse Midwife

## 2022-02-02 DIAGNOSIS — Z3A19 19 weeks gestation of pregnancy: Secondary | ICD-10-CM

## 2022-02-02 DIAGNOSIS — O09892 Supervision of other high risk pregnancies, second trimester: Secondary | ICD-10-CM

## 2022-02-04 ENCOUNTER — Ambulatory Visit (INDEPENDENT_AMBULATORY_CARE_PROVIDER_SITE_OTHER): Payer: Medicaid Other | Admitting: Obstetrics

## 2022-02-04 ENCOUNTER — Other Ambulatory Visit: Payer: Self-pay

## 2022-02-04 VITALS — BP 109/76 | Wt 151.0 lb

## 2022-02-04 DIAGNOSIS — O0992 Supervision of high risk pregnancy, unspecified, second trimester: Secondary | ICD-10-CM

## 2022-02-04 NOTE — Progress Notes (Signed)
No vb. No lof, pt had u/s 3/8 for anatomy. ?

## 2022-02-04 NOTE — Progress Notes (Signed)
ROB at [redacted]w[redacted]d Not feeling fetal movement yet. Anatomy [place]cheduled for 02/11/22. Endorses daily use of marijuana and cigarettes. Counseled on risks. Not interested in help with quitting at this time. Met with Angie Obsorne today. Having some round ligament pain. Discussed comfort measures. Reviewed normal course of second trimester. ROB in 4 weeks. ? ?MLloyd Huger CNM ?

## 2022-02-11 ENCOUNTER — Other Ambulatory Visit (INDEPENDENT_AMBULATORY_CARE_PROVIDER_SITE_OTHER): Payer: Medicaid Other

## 2022-02-11 ENCOUNTER — Other Ambulatory Visit: Payer: Self-pay

## 2022-02-11 ENCOUNTER — Other Ambulatory Visit: Payer: Self-pay | Admitting: Certified Nurse Midwife

## 2022-02-11 DIAGNOSIS — Z3A20 20 weeks gestation of pregnancy: Secondary | ICD-10-CM

## 2022-02-11 DIAGNOSIS — Z3482 Encounter for supervision of other normal pregnancy, second trimester: Secondary | ICD-10-CM

## 2022-02-16 ENCOUNTER — Encounter: Payer: Self-pay | Admitting: Certified Nurse Midwife

## 2022-02-16 ENCOUNTER — Other Ambulatory Visit: Payer: Self-pay | Admitting: Certified Nurse Midwife

## 2022-02-16 DIAGNOSIS — O43113 Circumvallate placenta, third trimester: Secondary | ICD-10-CM

## 2022-03-04 ENCOUNTER — Encounter: Payer: Medicaid Other | Admitting: Certified Nurse Midwife

## 2022-03-09 ENCOUNTER — Ambulatory Visit (INDEPENDENT_AMBULATORY_CARE_PROVIDER_SITE_OTHER): Payer: Medicaid Other | Admitting: Certified Nurse Midwife

## 2022-03-09 ENCOUNTER — Encounter: Payer: Self-pay | Admitting: Certified Nurse Midwife

## 2022-03-09 VITALS — BP 118/75 | HR 103 | Wt 161.1 lb

## 2022-03-09 DIAGNOSIS — O0992 Supervision of high risk pregnancy, unspecified, second trimester: Secondary | ICD-10-CM

## 2022-03-09 DIAGNOSIS — Z3A24 24 weeks gestation of pregnancy: Secondary | ICD-10-CM

## 2022-03-09 LAB — POCT URINALYSIS DIPSTICK OB
Bilirubin, UA: NEGATIVE
Blood, UA: NEGATIVE
Glucose, UA: NEGATIVE
Ketones, UA: NEGATIVE
Leukocytes, UA: NEGATIVE
Nitrite, UA: NEGATIVE
POC,PROTEIN,UA: NEGATIVE
Spec Grav, UA: 1.01 (ref 1.010–1.025)
Urobilinogen, UA: 0.2 E.U./dL
pH, UA: 7 (ref 5.0–8.0)

## 2022-03-09 NOTE — Patient Instructions (Signed)
Oral Glucose Tolerance Test During Pregnancy Why am I having this test? The oral glucose tolerance test (OGTT) is done to check how your body processes blood sugar (glucose). This is one of several tests used to diagnose diabetes that develops during pregnancy (gestational diabetes mellitus). Gestational diabetes is a short-term form of diabetes that some women develop while they are pregnant. It usually occurs during the second trimester of pregnancy and goes away after delivery. Testing, or screening, for gestational diabetes usually occurs at weeks 24-28 of pregnancy. You may have the OGTT test after having a 1-hour glucose screening test if the results from that test indicate that you may have gestational diabetes. This test may also be needed if: You have a history of gestational diabetes. There is a history of giving birth to very large babies or of losing pregnancies (having stillbirths). You have signs and symptoms of diabetes, such as: Changes in your eyesight. Tingling or numbness in your hands or feet. Changes in hunger, thirst, and urination, and these are not explained by your pregnancy. What is being tested? This test measures the amount of glucose in your blood at different times during a period of 3 hours. This shows how well your body can process glucose. What kind of sample is taken?  Blood samples are required for this test. They are usually collected by inserting a needle into a blood vessel. How do I prepare for this test? For 3 days before your test, eat normally. Have plenty of carbohydrate-rich foods. Follow instructions from your health care provider about: Eating or drinking restrictions on the day of the test. You may be asked not to eat or drink anything other than water (to fast) starting 8-10 hours before the test. Changing or stopping your regular medicines. Some medicines may interfere with this test. Tell a health care provider about: All medicines you are  taking, including vitamins, herbs, eye drops, creams, and over-the-counter medicines. Any blood disorders you have. Any surgeries you have had. Any medical conditions you have. What happens during the test? First, your blood glucose will be measured. This is referred to as your fasting blood glucose because you fasted before the test. Then, you will drink a glucose solution that contains a certain amount of glucose. Your blood glucose will be measured again 1, 2, and 3 hours after you drink the solution. This test takes about 3 hours to complete. You will need to stay at the testing location during this time. During the testing period: Do not eat or drink anything other than the glucose solution. Do not exercise. Do not use any products that contain nicotine or tobacco, such as cigarettes, e-cigarettes, and chewing tobacco. These can affect your test results. If you need help quitting, ask your health care provider. The testing procedure may vary among health care providers and hospitals. How are the results reported? Your results will be reported as milligrams of glucose per deciliter of blood (mg/dL) or millimoles per liter (mmol/L). There is more than one source for screening and diagnosis reference values used to diagnose gestational diabetes. Your health care provider will compare your results to normal values that were established after testing a large group of people (reference values). Reference values may vary among labs and hospitals. For this test (Carpenter-Coustan), reference values are: Fasting: 95 mg/dL (5.3 mmol/L). 1 hour: 180 mg/dL (10.0 mmol/L). 2 hour: 155 mg/dL (8.6 mmol/L). 3 hour: 140 mg/dL (7.8 mmol/L). What do the results mean? Results below the reference values are   considered normal. If two or more of your blood glucose levels are at or above the reference values, you may be diagnosed with gestational diabetes. If only one level is high, your health care provider may  suggest repeat testing or other tests to confirm a diagnosis. Talk with your health care provider about what your results mean. Questions to ask your health care provider Ask your health care provider, or the department that is doing the test: When will my results be ready? How will I get my results? What are my treatment options? What other tests do I need? What are my next steps? Summary The oral glucose tolerance test (OGTT) is one of several tests used to diagnose diabetes that develops during pregnancy (gestational diabetes mellitus). Gestational diabetes is a short-term form of diabetes that some women develop while they are pregnant. You may have the OGTT test after having a 1-hour glucose screening test if the results from that test show that you may have gestational diabetes. You may also have this test if you have any symptoms or risk factors for this type of diabetes. Talk with your health care provider about what your results mean. This information is not intended to replace advice given to you by your health care provider. Make sure you discuss any questions you have with your health care provider. Document Revised: 04/18/2020 Document Reviewed: 04/18/2020 Elsevier Patient Education  2023 Elsevier Inc.  

## 2022-03-09 NOTE — Progress Notes (Signed)
ROB doing well, feeling good movement. Movement audible today with doppler.  ?Discussed 28 wk visit, glucose information given . Pt verbalizes understanding. Follow up 4 wks for glucose screen and ROB with Missy.  ? ?Doreene Burke, CNM  ?

## 2022-03-12 ENCOUNTER — Telehealth: Payer: Self-pay | Admitting: Obstetrics

## 2022-03-12 ENCOUNTER — Encounter: Payer: Self-pay | Admitting: Obstetrics

## 2022-03-12 NOTE — Telephone Encounter (Signed)
Pt called and stated she has a dentist appointment today and needs medical clearance from a physician for this appointment. Please advise.  ?

## 2022-03-17 ENCOUNTER — Other Ambulatory Visit: Payer: Self-pay

## 2022-03-17 DIAGNOSIS — F1991 Other psychoactive substance use, unspecified, in remission: Secondary | ICD-10-CM

## 2022-03-17 DIAGNOSIS — O43112 Circumvallate placenta, second trimester: Secondary | ICD-10-CM

## 2022-03-19 ENCOUNTER — Other Ambulatory Visit: Payer: Self-pay | Admitting: Certified Nurse Midwife

## 2022-03-19 ENCOUNTER — Ambulatory Visit: Payer: Medicaid Other | Attending: Maternal & Fetal Medicine

## 2022-03-19 ENCOUNTER — Other Ambulatory Visit: Payer: Self-pay

## 2022-03-19 DIAGNOSIS — O99332 Smoking (tobacco) complicating pregnancy, second trimester: Secondary | ICD-10-CM | POA: Diagnosis not present

## 2022-03-19 DIAGNOSIS — F1910 Other psychoactive substance abuse, uncomplicated: Secondary | ICD-10-CM | POA: Diagnosis not present

## 2022-03-19 DIAGNOSIS — Z3A26 26 weeks gestation of pregnancy: Secondary | ICD-10-CM | POA: Diagnosis not present

## 2022-03-19 DIAGNOSIS — O43113 Circumvallate placenta, third trimester: Secondary | ICD-10-CM

## 2022-03-19 DIAGNOSIS — O99322 Drug use complicating pregnancy, second trimester: Secondary | ICD-10-CM | POA: Insufficient documentation

## 2022-03-19 DIAGNOSIS — O43112 Circumvallate placenta, second trimester: Secondary | ICD-10-CM | POA: Diagnosis not present

## 2022-04-09 ENCOUNTER — Other Ambulatory Visit: Payer: Self-pay

## 2022-04-09 DIAGNOSIS — F1991 Other psychoactive substance use, unspecified, in remission: Secondary | ICD-10-CM

## 2022-04-09 DIAGNOSIS — O43113 Circumvallate placenta, third trimester: Secondary | ICD-10-CM

## 2022-04-09 DIAGNOSIS — O43112 Circumvallate placenta, second trimester: Secondary | ICD-10-CM

## 2022-04-09 DIAGNOSIS — O99333 Smoking (tobacco) complicating pregnancy, third trimester: Secondary | ICD-10-CM

## 2022-04-14 ENCOUNTER — Ambulatory Visit: Payer: Medicaid Other | Attending: Maternal & Fetal Medicine

## 2022-04-14 ENCOUNTER — Other Ambulatory Visit: Payer: Self-pay

## 2022-04-14 DIAGNOSIS — Z3A30 30 weeks gestation of pregnancy: Secondary | ICD-10-CM

## 2022-04-14 DIAGNOSIS — O99323 Drug use complicating pregnancy, third trimester: Secondary | ICD-10-CM | POA: Diagnosis not present

## 2022-04-14 DIAGNOSIS — O43112 Circumvallate placenta, second trimester: Secondary | ICD-10-CM | POA: Diagnosis present

## 2022-04-14 DIAGNOSIS — F1991 Other psychoactive substance use, unspecified, in remission: Secondary | ICD-10-CM | POA: Diagnosis not present

## 2022-04-14 DIAGNOSIS — O99320 Drug use complicating pregnancy, unspecified trimester: Secondary | ICD-10-CM

## 2022-04-14 DIAGNOSIS — O99333 Smoking (tobacco) complicating pregnancy, third trimester: Secondary | ICD-10-CM | POA: Insufficient documentation

## 2022-04-14 DIAGNOSIS — O43113 Circumvallate placenta, third trimester: Secondary | ICD-10-CM

## 2022-04-16 ENCOUNTER — Encounter: Payer: Medicaid Other | Admitting: Obstetrics

## 2022-04-16 ENCOUNTER — Other Ambulatory Visit: Payer: Medicaid Other

## 2022-04-17 ENCOUNTER — Other Ambulatory Visit: Payer: Medicaid Other

## 2022-04-17 DIAGNOSIS — O09893 Supervision of other high risk pregnancies, third trimester: Secondary | ICD-10-CM

## 2022-04-17 DIAGNOSIS — Z3A29 29 weeks gestation of pregnancy: Secondary | ICD-10-CM

## 2022-04-17 NOTE — Progress Notes (Unsigned)
d 

## 2022-04-18 ENCOUNTER — Encounter: Payer: Self-pay | Admitting: Certified Nurse Midwife

## 2022-04-18 ENCOUNTER — Other Ambulatory Visit: Payer: Self-pay | Admitting: Certified Nurse Midwife

## 2022-04-18 LAB — CBC
Hematocrit: 30.6 % — ABNORMAL LOW (ref 34.0–46.6)
Hemoglobin: 10.5 g/dL — ABNORMAL LOW (ref 11.1–15.9)
MCH: 32.5 pg (ref 26.6–33.0)
MCHC: 34.3 g/dL (ref 31.5–35.7)
MCV: 95 fL (ref 79–97)
Platelets: 252 10*3/uL (ref 150–450)
RBC: 3.23 x10E6/uL — ABNORMAL LOW (ref 3.77–5.28)
RDW: 11.7 % (ref 11.7–15.4)
WBC: 10.5 10*3/uL (ref 3.4–10.8)

## 2022-04-18 LAB — RPR: RPR Ser Ql: NONREACTIVE

## 2022-04-18 LAB — GLUCOSE, 1 HOUR GESTATIONAL: Gestational Diabetes Screen: 121 mg/dL (ref 70–139)

## 2022-04-18 MED ORDER — FUSION PLUS PO CAPS: 1.0000 | ORAL_CAPSULE | Freq: Every day | ORAL | 11 refills | Status: DC

## 2022-04-30 ENCOUNTER — Encounter: Payer: Self-pay | Admitting: Obstetrics

## 2022-04-30 ENCOUNTER — Other Ambulatory Visit: Payer: Self-pay

## 2022-04-30 ENCOUNTER — Ambulatory Visit (INDEPENDENT_AMBULATORY_CARE_PROVIDER_SITE_OTHER): Payer: Medicaid Other | Admitting: Obstetrics

## 2022-04-30 VITALS — BP 133/79 | HR 99 | Wt 163.8 lb

## 2022-04-30 DIAGNOSIS — F1210 Cannabis abuse, uncomplicated: Secondary | ICD-10-CM

## 2022-04-30 DIAGNOSIS — Z3A32 32 weeks gestation of pregnancy: Secondary | ICD-10-CM

## 2022-04-30 LAB — POCT URINALYSIS DIPSTICK OB
Bilirubin, UA: NEGATIVE
Blood, UA: NEGATIVE
Glucose, UA: NEGATIVE
Ketones, UA: NEGATIVE
Leukocytes, UA: NEGATIVE
Nitrite, UA: NEGATIVE
POC,PROTEIN,UA: NEGATIVE
Spec Grav, UA: 1.01 (ref 1.010–1.025)
Urobilinogen, UA: 0.2 E.U./dL
pH, UA: 7 (ref 5.0–8.0)

## 2022-04-30 MED ORDER — PRENATAL MULTIVITAMIN CH: 1.0000 | ORAL_TABLET | Freq: Every day | ORAL | 3 refills | Status: DC

## 2022-04-30 NOTE — Progress Notes (Signed)
ROB at [redacted]w[redacted]d. Active baby. Denies ctx, vaginal bleeding, LOF. Tooth implant fell out; getting a bridge. Getting over a cold. Encouraged CBE. Taking PNV and iron. Has decreased number of cigarettes; still smoking marijuana. UDS today. Discussed GBS at future visit. Would like CBC repeated at 36 weeks. RTC in 2 weeks.  Guadlupe Spanish, CNM

## 2022-05-07 ENCOUNTER — Other Ambulatory Visit: Payer: Self-pay

## 2022-05-07 DIAGNOSIS — F1991 Other psychoactive substance use, unspecified, in remission: Secondary | ICD-10-CM

## 2022-05-07 DIAGNOSIS — O43113 Circumvallate placenta, third trimester: Secondary | ICD-10-CM

## 2022-05-07 DIAGNOSIS — O99333 Smoking (tobacco) complicating pregnancy, third trimester: Secondary | ICD-10-CM

## 2022-05-11 LAB — MONITOR DRUG PROFILE 14(MW)
BARBITURATE SCREEN URINE: NEGATIVE ng/mL
BENZODIAZEPINE SCREEN, URINE: NEGATIVE ng/mL
Buprenorphine, Urine: NEGATIVE ng/mL
Cocaine (Metab) Scrn, Ur: NEGATIVE ng/mL
Creatinine(Crt), U: 99 mg/dL (ref 20.0–300.0)
Fentanyl, Urine: NEGATIVE pg/mL
Meperidine Screen, Urine: NEGATIVE ng/mL
Methadone Screen, Urine: NEGATIVE ng/mL
OXYCODONE+OXYMORPHONE UR QL SCN: NEGATIVE ng/mL
Opiate Scrn, Ur: NEGATIVE ng/mL
Ph of Urine: 8.2 (ref 4.5–8.9)
Phencyclidine Qn, Ur: NEGATIVE ng/mL
Propoxyphene Scrn, Ur: NEGATIVE ng/mL
SPECIFIC GRAVITY: 1.013
Tramadol Screen, Urine: NEGATIVE ng/mL

## 2022-05-11 LAB — AMPHETAMINE (GC/MS), URINE
Amphetamine: NEGATIVE
Amphetamines: POSITIVE — AB
METHAMPHETAMINE (GC/MS): >3000 ng/mL
Methamphetamine: POSITIVE — AB

## 2022-05-12 ENCOUNTER — Ambulatory Visit: Payer: Medicaid Other

## 2022-05-12 NOTE — Progress Notes (Deleted)
Pt was a "No Show" for MFM appt on 05/12/22 @ [facility]

## 2022-05-18 ENCOUNTER — Encounter: Payer: Medicaid Other | Admitting: Obstetrics

## 2022-06-09 ENCOUNTER — Inpatient Hospital Stay: Payer: Medicaid Other | Admitting: Anesthesiology

## 2022-06-09 ENCOUNTER — Other Ambulatory Visit: Payer: Self-pay

## 2022-06-09 ENCOUNTER — Encounter: Payer: Self-pay | Admitting: Obstetrics

## 2022-06-09 ENCOUNTER — Inpatient Hospital Stay
Admission: EM | Admit: 2022-06-09 | Discharge: 2022-06-12 | DRG: 807 | Disposition: A | Discharge status: Home / Self Care | Payer: Medicaid Other | Attending: Certified Nurse Midwife | Admitting: Certified Nurse Midwife

## 2022-06-09 DIAGNOSIS — O99324 Drug use complicating childbirth: Principal | ICD-10-CM | POA: Diagnosis present

## 2022-06-09 DIAGNOSIS — F1910 Other psychoactive substance abuse, uncomplicated: Secondary | ICD-10-CM | POA: Diagnosis not present

## 2022-06-09 DIAGNOSIS — O99323 Drug use complicating pregnancy, third trimester: Secondary | ICD-10-CM | POA: Diagnosis not present

## 2022-06-09 DIAGNOSIS — F1210 Cannabis abuse, uncomplicated: Secondary | ICD-10-CM | POA: Diagnosis not present

## 2022-06-09 DIAGNOSIS — F1290 Cannabis use, unspecified, uncomplicated: Secondary | ICD-10-CM | POA: Diagnosis present

## 2022-06-09 DIAGNOSIS — Z3A37 37 weeks gestation of pregnancy: Secondary | ICD-10-CM | POA: Diagnosis not present

## 2022-06-09 DIAGNOSIS — O26893 Other specified pregnancy related conditions, third trimester: Secondary | ICD-10-CM | POA: Diagnosis present

## 2022-06-09 HISTORY — DX: Other specified health status: Z78.9

## 2022-06-09 LAB — CBC
HCT: 37.2 % (ref 36.0–46.0)
Hemoglobin: 12.8 g/dL (ref 12.0–15.0)
MCH: 31.3 pg (ref 26.0–34.0)
MCHC: 34.4 g/dL (ref 30.0–36.0)
MCV: 91 fL (ref 80.0–100.0)
Platelets: 184 10*3/uL (ref 150–400)
RBC: 4.09 MIL/uL (ref 3.87–5.11)
RDW: 12.8 % (ref 11.5–15.5)
WBC: 13.5 10*3/uL — ABNORMAL HIGH (ref 4.0–10.5)
nRBC: 0 % (ref 0.0–0.2)

## 2022-06-09 LAB — TYPE AND SCREEN
ABO/RH(D): A POS
Antibody Screen: NEGATIVE

## 2022-06-09 LAB — CHLAMYDIA/NGC RT PCR (ARMC ONLY)
Chlamydia Tr: NOT DETECTED
N gonorrhoeae: NOT DETECTED

## 2022-06-09 LAB — URINE DRUG SCREEN, QUALITATIVE (ARMC ONLY)
Amphetamines, Ur Screen: POSITIVE — AB
Barbiturates, Ur Screen: NOT DETECTED
Benzodiazepine, Ur Scrn: NOT DETECTED
Cannabinoid 50 Ng, Ur ~~LOC~~: POSITIVE — AB
Cocaine Metabolite,Ur ~~LOC~~: NOT DETECTED
MDMA (Ecstasy)Ur Screen: NOT DETECTED
Methadone Scn, Ur: NOT DETECTED
Opiate, Ur Screen: NOT DETECTED
Phencyclidine (PCP) Ur S: NOT DETECTED
Tricyclic, Ur Screen: NOT DETECTED

## 2022-06-09 LAB — GROUP B STREP BY PCR: Group B strep by PCR: NEGATIVE

## 2022-06-09 LAB — ABO/RH: ABO/RH(D): A POS

## 2022-06-09 MED ORDER — TERBUTALINE SULFATE 1 MG/ML IJ SOLN
0.2500 mg | Freq: Once | INTRAMUSCULAR | Status: DC
Start: 2022-06-09 — End: 2022-06-10

## 2022-06-09 MED ORDER — HYDROXYZINE HCL 25 MG PO TABS: 50.0000 mg | ORAL_TABLET | Freq: Four times a day (QID) | ORAL | Status: DC | PRN

## 2022-06-09 MED ORDER — LACTATED RINGERS IV SOLN: 500.0000 mL | INTRAVENOUS | Status: DC | PRN

## 2022-06-09 MED ORDER — PHENYLEPHRINE 80 MCG/ML (10ML) SYRINGE FOR IV PUSH (FOR BLOOD PRESSURE SUPPORT): 80.0000 ug | PREFILLED_SYRINGE | INTRAVENOUS | Status: DC | PRN

## 2022-06-09 MED ORDER — OXYCODONE-ACETAMINOPHEN 5-325 MG PO TABS: 1.0000 | ORAL_TABLET | ORAL | Status: DC | PRN

## 2022-06-09 MED ORDER — EPHEDRINE 5 MG/ML INJ: 10.0000 mg | INTRAVENOUS | Status: DC | PRN

## 2022-06-09 MED ORDER — OXYTOCIN-SODIUM CHLORIDE 30-0.9 UT/500ML-% IV SOLN
2.5000 [IU]/h | INTRAVENOUS | Status: DC
Start: 2022-06-09 — End: 2022-06-10
  Administered 2022-06-10: 2.5 [IU]/h via INTRAVENOUS
  Filled 2022-06-09: qty 500

## 2022-06-09 MED ORDER — LIDOCAINE-EPINEPHRINE (PF) 1.5 %-1:200000 IJ SOLN
INTRAMUSCULAR | Status: DC | PRN
  Administered 2022-06-09: 3 mL via EPIDURAL

## 2022-06-09 MED ORDER — LACTATED RINGERS IV SOLN: 500.0000 mL | Freq: Once | INTRAVENOUS | Status: DC

## 2022-06-09 MED ORDER — TERBUTALINE SULFATE 1 MG/ML IJ SOLN
INTRAMUSCULAR | Status: AC
  Filled 2022-06-09: qty 1

## 2022-06-09 MED ORDER — DIPHENHYDRAMINE HCL 50 MG/ML IJ SOLN: 12.5000 mg | INTRAMUSCULAR | Status: DC | PRN

## 2022-06-09 MED ORDER — LIDOCAINE HCL (PF) 1 % IJ SOLN
30.0000 mL | INTRAMUSCULAR | Status: DC | PRN
  Administered 2022-06-10: 30 mL via SUBCUTANEOUS

## 2022-06-09 MED ORDER — SOD CITRATE-CITRIC ACID 500-334 MG/5ML PO SOLN: 30.0000 mL | ORAL | Status: DC | PRN

## 2022-06-09 MED ORDER — OXYTOCIN BOLUS FROM INFUSION
333.0000 mL | Freq: Once | INTRAVENOUS | Status: AC
  Administered 2022-06-10: 333 mL via INTRAVENOUS

## 2022-06-09 MED ORDER — LACTATED RINGERS IV SOLN
INTRAVENOUS | Status: DC
Start: 2022-06-09 — End: 2022-06-10

## 2022-06-09 MED ORDER — FENTANYL-BUPIVACAINE-NACL 0.5-0.125-0.9 MG/250ML-% EP SOLN
EPIDURAL | Status: AC
  Filled 2022-06-09: qty 250

## 2022-06-09 MED ORDER — ACETAMINOPHEN 325 MG PO TABS: 650.0000 mg | ORAL_TABLET | ORAL | Status: DC | PRN

## 2022-06-09 MED ORDER — BUPIVACAINE HCL (PF) 0.25 % IJ SOLN
INTRAMUSCULAR | Status: DC | PRN
  Administered 2022-06-09: 8 mL via EPIDURAL

## 2022-06-09 MED ORDER — FENTANYL-BUPIVACAINE-NACL 0.5-0.125-0.9 MG/250ML-% EP SOLN
12.0000 mL/h | EPIDURAL | Status: DC | PRN
  Administered 2022-06-09: 12 mL/h via EPIDURAL

## 2022-06-09 MED ORDER — ONDANSETRON HCL 4 MG/2ML IJ SOLN: 4.0000 mg | Freq: Four times a day (QID) | INTRAMUSCULAR | Status: DC | PRN

## 2022-06-09 MED ORDER — OXYCODONE-ACETAMINOPHEN 5-325 MG PO TABS: 2.0000 | ORAL_TABLET | ORAL | Status: DC | PRN

## 2022-06-09 NOTE — H&P (Signed)
History and Physical   HPI  Sabrina Edwards is a 26 y.o. G2P0010 at [redacted]w[redacted]d Estimated Date of Delivery: 06/25/22 who is being admitted for labor management. She reports that she started having contractions yesterday morning. They have gotten gradually stronger and more intense. She endorses good fetal movement. She denies LOF. Her UDS is positive for MJ and amphetamine.   OB History  OB History  Gravida Para Term Preterm AB Living  2 0 0 0 1 0  SAB IAB Ectopic Multiple Live Births  0 1 0 0 0    # Outcome Date GA Lbr Len/2nd Weight Sex Delivery Anes PTL Lv  2 Current           1 IAB 2016            PROBLEM LIST  Pregnancy complications or risks: Patient Active Problem List   Diagnosis Date Noted   Labor and delivery, indication for care 06/09/2022    Prenatal labs and studies: ABO, Rh: --/--/A POS (07/18 1944) Antibody: NEG (07/18 1944) Rubella: 2.38 (02/02 1059) RPR: Non Reactive (05/26 1008)  HBsAg: Negative (02/02 1059)  HIV: Non Reactive (02/02 1059)  NWG:NFAOZHYQ/-- (07/18 1908)   Past Medical History:  Diagnosis Date   Medical history non-contributory    Patient denies medical problems      Past Surgical History:  Procedure Laterality Date   NO PAST SURGERIES       Medications    Current Discharge Medication List     CONTINUE these medications which have NOT CHANGED   Details  Prenatal Vit-Fe Fumarate-FA (PRENATAL MULTIVITAMIN) TABS tablet Take 1 tablet by mouth daily at 12 noon. Qty: 30 tablet, Refills: 3         Allergies  Patient has no known allergies.  Review of Systems  Pertinent items are noted in HPI.  Physical Exam  BP 129/75   Pulse 64   Temp 98 F (36.7 C) (Oral)   Resp 20   Ht 5\' 9"  (1.753 m)   Wt 75.8 kg   LMP 08/23/2021 (Within Weeks)   SpO2 100%   BMI 24.66 kg/m   Lungs:  CTAB Cardio: RRR, murmur Abd: Soft, gravid, NT Presentation: cephalic  CERVIX: Dilation: 4 Effacement (%): 80 Cervical Position:  [place]: -1 Presentation: Vertex Exam by:: [email or web address] RN  See Prenatal records for more detailed PE.     FHR:  Baseline: 140 Variability: moderate Accelerations: present, 15x15 Decelerations: absent Toco: regular, every 2 minutes Category 1  Test Results  Results for orders placed or performed during the hospital encounter of 06/09/22 (from the past 24 hour(s))  Urine Drug Screen, Qualitative ([facility] only)     Status: Abnormal   Collection Time: 06/09/22  7:08 PM  Result Value Ref Range   Tricyclic, Ur Screen NONE DETECTED NONE DETECTED   Amphetamines, Ur Screen POSITIVE (A) NONE DETECTED   MDMA (Ecstasy)Ur Screen NONE DETECTED NONE DETECTED   Cocaine Metabolite,Ur Colon NONE DETECTED NONE DETECTED   Opiate, Ur Screen NONE DETECTED NONE DETECTED   Phencyclidine (PCP) Ur S NONE DETECTED NONE DETECTED   Cannabinoid 50 Ng, Ur [place] POSITIVE (A) NONE DETECTED   Barbiturates, Ur Screen NONE DETECTED NONE DETECTED   Benzodiazepine, Ur Scrn NONE DETECTED NONE DETECTED   Methadone Scn, Ur NONE DETECTED NONE DETECTED  Group B strep by PCR     Status: None   Collection Time: 06/09/22  7:08 PM   Specimen: Vaginal/Rectal; Genital  Result Value Ref Range  Group B strep by PCR NEGATIVE NEGATIVE  CBC     Status: Abnormal   Collection Time: 06/09/22  7:44 PM  Result Value Ref Range   WBC 13.5 (H) 4.0 - 10.5 K/uL   RBC 4.09 3.87 - 5.11 MIL/uL   Hemoglobin 12.8 12.0 - 15.0 g/dL   HCT 16.6 06.0 - 04.5 %   MCV 91.0 80.0 - 100.0 fL   MCH 31.3 26.0 - 34.0 pg   MCHC 34.4 30.0 - 36.0 g/dL   RDW 99.7 74.1 - 42.3 %   Platelets 184 150 - 400 K/uL   nRBC 0.0 0.0 - 0.2 %  Type and screen [facility]     Status: None   Collection Time: 06/09/22  7:44 PM  Result Value Ref Range   ABO/RH(D) A POS    Antibody Screen NEG    Sample Expiration      06/12/2022,2359 Performed at [facility], [address] [place], [place] [address]    Group B Strep  negative  Assessment   G2P0010 at [redacted]w[redacted]d Estimated Date of Delivery: 06/25/22  Reassuring maternal/fetal status.  Patient Active Problem List   Diagnosis Date Noted   Labor and delivery, indication for care 06/09/2022    Plan  1. Admit to L&D : 2. EFM: Continuous -- Category 1 3. Pharmacologic pain relief if desired.   4. Admission labs  5. Anticipate NSVD 6. Dr. Okey Dupre notified of admission  Guadlupe Spanish, [facility] 06/09/2022 8:48 PM

## 2022-06-09 NOTE — Anesthesia Procedure Notes (Signed)
Epidural Patient location during procedure: OB Start time: 06/09/2022 8:31 PM End time: 06/09/2022 8:40 PM  Staffing Anesthesiologist: Reed Breech, MD Performed: anesthesiologist   Preanesthetic Checklist Completed: patient identified, IV checked, risks and benefits discussed, surgical consent, monitors and equipment checked, pre-op evaluation and timeout performed  Epidural Patient position: sitting Prep: Betadine Patient monitoring: heart rate, continuous pulse ox and blood pressure Approach: midline Location: L2-L3 Injection technique: LOR air  Needle:  Needle type: Tuohy  Needle gauge: 17 G Needle length: 9 cm Needle insertion depth: 5 cm Catheter at skin depth: 10 cm Test dose: negative and 1.5% lidocaine with Epi 1:200 K  Assessment Sensory level: T4  Additional Notes Straightforward placement without apparent complications.Reason for block:procedure for pain

## 2022-06-09 NOTE — Anesthesia Preprocedure Evaluation (Addendum)
Anesthesia Evaluation  Patient identified by MRN, date of birth, ID band Patient awake    Reviewed: Allergy & Precautions, NPO status , Patient's Chart, lab work & pertinent test results  History of Anesthesia Complications Negative for: history of anesthetic complications  Airway Mallampati: II   Neck ROM: Full    Dental no notable dental hx.    Pulmonary Current Smoker and Patient abstained from smoking.,    Pulmonary exam normal breath sounds clear to auscultation       Cardiovascular Exercise Tolerance: Good negative cardio ROS Normal cardiovascular exam Rhythm:Regular Rate:Normal     Neuro/Psych Marijuana and amphetamine use    GI/Hepatic negative GI ROS,   Endo/Other  negative endocrine ROS  Renal/GU negative Renal ROS     Musculoskeletal   Abdominal   Peds  Hematology negative hematology ROS (+)   Anesthesia Other Findings   Reproductive/Obstetrics                            Anesthesia Physical Anesthesia Plan  ASA: 2  Anesthesia Plan: Epidural   Post-op Pain Management:    Induction:   PONV Risk Score and Plan: 2 and Treatment may vary due to age or medical condition  Airway Management Planned: Natural Airway  Additional Equipment:   Intra-op Plan:   Post-operative Plan:   Informed Consent: I have reviewed the patients History and Physical, chart, labs and discussed the procedure including the risks, benefits and alternatives for the proposed anesthesia with the patient or authorized representative who has indicated his/her understanding and acceptance.     Dental Advisory Given  Plan Discussed with:   Anesthesia Plan Comments: (Patient reports no bleeding problems and no anticoagulant use.   Patient consented for risks of anesthesia including but not limited to:  - adverse reactions to medications - risk of bleeding, infection and or nerve damage from  epidural that could lead to paralysis - risk of headache or failed epidural - nerve damage due to positioning - that if epidural is used for C-section that there is a chance of epidural failure requiring spinal placement or conversion to GA - Damage to heart, brain, lungs, other parts of body or loss of life  Patient voiced understanding.)        Anesthesia Quick Evaluation

## 2022-06-09 NOTE — OB Triage Note (Signed)
Pt presents c/o ctx that strted yesterday and have progressively got worse. PT states ctx are about 3 mins apart. Pt denies bleeding or LOF. Pt reports positive fetal movement. VSS. Will continue to monitor.

## 2022-06-10 ENCOUNTER — Encounter: Payer: Self-pay | Admitting: Obstetrics and Gynecology

## 2022-06-10 DIAGNOSIS — F1910 Other psychoactive substance abuse, uncomplicated: Secondary | ICD-10-CM | POA: Diagnosis not present

## 2022-06-10 DIAGNOSIS — O99324 Drug use complicating childbirth: Secondary | ICD-10-CM | POA: Diagnosis not present

## 2022-06-10 DIAGNOSIS — F1210 Cannabis abuse, uncomplicated: Secondary | ICD-10-CM | POA: Diagnosis not present

## 2022-06-10 DIAGNOSIS — Z3A37 37 weeks gestation of pregnancy: Secondary | ICD-10-CM | POA: Diagnosis not present

## 2022-06-10 LAB — CBC
HCT: 33.5 % — ABNORMAL LOW (ref 36.0–46.0)
Hemoglobin: 11.5 g/dL — ABNORMAL LOW (ref 12.0–15.0)
MCH: 31.3 pg (ref 26.0–34.0)
MCHC: 34.3 g/dL (ref 30.0–36.0)
MCV: 91.3 fL (ref 80.0–100.0)
Platelets: 148 10*3/uL — ABNORMAL LOW (ref 150–400)
RBC: 3.67 MIL/uL — ABNORMAL LOW (ref 3.87–5.11)
RDW: 12.7 % (ref 11.5–15.5)
WBC: 19.5 10*3/uL — ABNORMAL HIGH (ref 4.0–10.5)
nRBC: 0 % (ref 0.0–0.2)

## 2022-06-10 LAB — RPR: RPR Ser Ql: NONREACTIVE

## 2022-06-10 MED ORDER — DIPHENHYDRAMINE HCL 25 MG PO CAPS: 25.0000 mg | ORAL_CAPSULE | Freq: Four times a day (QID) | ORAL | Status: DC | PRN

## 2022-06-10 MED ORDER — IBUPROFEN 600 MG PO TABS
600.0000 mg | ORAL_TABLET | Freq: Four times a day (QID) | ORAL | Status: DC
  Administered 2022-06-10 – 2022-06-12 (×9): 600 mg via ORAL
  Filled 2022-06-10 (×9): qty 1

## 2022-06-10 MED ORDER — DOCUSATE SODIUM 100 MG PO CAPS: 100.0000 mg | ORAL_CAPSULE | Freq: Two times a day (BID) | ORAL | Status: DC

## 2022-06-10 MED ORDER — SIMETHICONE 80 MG PO CHEW: 80.0000 mg | CHEWABLE_TABLET | ORAL | Status: DC | PRN

## 2022-06-10 MED ORDER — TETANUS-DIPHTH-ACELL PERTUSSIS 5-2.5-18.5 LF-MCG/0.5 IM SUSY: 0.5000 mL | PREFILLED_SYRINGE | Freq: Once | INTRAMUSCULAR | Status: DC

## 2022-06-10 MED ORDER — OXYTOCIN-SODIUM CHLORIDE 30-0.9 UT/500ML-% IV SOLN: 2.5000 [IU]/h | INTRAVENOUS | Status: DC | PRN

## 2022-06-10 MED ORDER — PRENATAL MULTIVITAMIN CH
1.0000 | ORAL_TABLET | Freq: Every day | ORAL | Status: DC
  Administered 2022-06-10 – 2022-06-12 (×3): 1 via ORAL
  Filled 2022-06-10 (×4): qty 1

## 2022-06-10 MED ORDER — COCONUT OIL OIL: 1.0000 | TOPICAL_OIL | Status: DC | PRN

## 2022-06-10 MED ORDER — METHYLERGONOVINE MALEATE 0.2 MG/ML IJ SOLN: 0.2000 mg | INTRAMUSCULAR | Status: DC | PRN

## 2022-06-10 MED ORDER — PRENATAL MULTIVITAMIN CH: 1.0000 | ORAL_TABLET | Freq: Every day | ORAL | Status: DC

## 2022-06-10 MED ORDER — ACETAMINOPHEN 325 MG PO TABS: 650.0000 mg | ORAL_TABLET | ORAL | Status: DC | PRN

## 2022-06-10 MED ORDER — METHYLERGONOVINE MALEATE 0.2 MG PO TABS: 0.2000 mg | ORAL_TABLET | ORAL | Status: DC | PRN

## 2022-06-10 MED ORDER — ACETAMINOPHEN 325 MG PO TABS
650.0000 mg | ORAL_TABLET | ORAL | Status: DC | PRN
  Administered 2022-06-10 – 2022-06-11 (×3): 650 mg via ORAL
  Filled 2022-06-10 (×3): qty 2

## 2022-06-10 MED ORDER — IBUPROFEN 600 MG PO TABS: 600.0000 mg | ORAL_TABLET | Freq: Four times a day (QID) | ORAL | Status: DC

## 2022-06-10 MED ORDER — BENZOCAINE-MENTHOL 20-0.5 % EX AERO: 1.0000 | INHALATION_SPRAY | CUTANEOUS | Status: DC | PRN

## 2022-06-10 MED ORDER — BENZOCAINE-MENTHOL 20-0.5 % EX AERO
1.0000 | INHALATION_SPRAY | Freq: Four times a day (QID) | CUTANEOUS | Status: DC | PRN
  Filled 2022-06-10: qty 56

## 2022-06-10 NOTE — Discharge Summary (Signed)
Postpartum Discharge Summary  Date of Service updated 06/12/2022     Patient Name: Sabrina Edwards DOB: 1996/11/22 MRN: [id number]  Date of admission: 06/09/2022 Delivery date:06/10/2022  Delivering provider: Lurlean Horns  Date of discharge: 06/12/2022  Admitting diagnosis: Labor and delivery, indication for care [O75.9] Intrauterine pregnancy: [redacted]w[redacted]d    Secondary diagnosis:  Principal Problem:   Labor and delivery, indication for care  Additional problems: +UDS    Discharge diagnosis: Term Pregnancy Delivered                                              Post partum procedures: NA Augmentation: AROM Complications: None  Hospital course: Onset of Labor With Vaginal Delivery      26y.o. yo G2P0010 at 317w6das admitted in Active Labor on 06/09/2022. Patient had an uncomplicated labor course as follows:  Membrane Rupture Time/Date: 1:08 AM ,06/10/2022   Delivery Method:Vaginal, Spontaneous  Episiotomy: None  Lacerations:  Labial  Patient had an uncomplicated postpartum course.  She is ambulating, tolerating a regular diet, passing flatus, and urinating well. Bottle feeding. Patient is discharged home in stable condition on 06/12/22. Will remain as border as infant needs to remain inpatient.   Newborn Data: Birth date:06/10/2022  Birth time:1:57 AM  Gender:Female  Living status:Living  Apgars:8 ,9  Weight:2940 g   Magnesium Sulfate received: No BMZ received: No Rhophylac:N/A MMR:N/A T-DaP: ordered  Flu: N/A Transfusion:No  Physical exam  Vitals:   06/11/22 0820 06/11/22 1601 06/11/22 2343 06/12/22 0835  BP: 125/81 125/85 123/86 121/71  Pulse: 76 60 69 72  Resp: 18 20 18 20   Temp: 98.3 F (36.8 C) 97.7 F (36.5 C) 97.7 F (36.5 C) 98 F (36.7 C)  TempSrc: Oral Oral Oral Oral  SpO2: 100%  99% 98%  Weight:      Height:       General: alert Lochia: appropriate Uterine Fundus: firm Incision: perineum with minimal swelling DVT Evaluation: No evidence of DVT  seen on physical exam. Negative Homan's sign. Labs: Lab Results  Component Value Date   WBC 19.5 (H) 06/10/2022   HGB 11.5 (L) 06/10/2022   HCT 33.5 (L) 06/10/2022   MCV 91.3 06/10/2022   PLT 148 (L) 06/10/2022      Latest Ref Rng & Units 08/12/2015    5:29 PM  CMP  Glucose 65 - 99 mg/dL 97   BUN 6 - 20 mg/dL 9   Creatinine 0.44 - 1.00 mg/dL 0.69   Sodium 135 - 145 mmol/L 138   Potassium 3.5 - 5.1 mmol/L 3.9   Chloride 101 - 111 mmol/L 101   CO2 22 - 32 mmol/L 29   Calcium 8.9 - 10.3 mg/dL 8.5   Total Protein 6.5 - 8.1 g/dL 7.0   Total Bilirubin 0.3 - 1.2 mg/dL 0.3   Alkaline Phos 38 - 126 U/L 88   AST 15 - 41 U/L 17   ALT 14 - 54 U/L 12    Edinburgh Score:    06/10/2022   12:43 PM  Edinburgh Postnatal Depression Scale Screening Tool  I have been able to laugh and see the funny side of things. 0  I have looked forward with enjoyment to things. 1  I have blamed myself unnecessarily when things went wrong. 2  I have been anxious or worried for no good reason.  2  I have felt scared or panicky for no good reason. 1  Things have been getting on top of me. 2  I have been so unhappy that I have had difficulty sleeping. 0  I have felt sad or miserable. 1  I have been so unhappy that I have been crying. 1  The thought of harming myself has occurred to me. 0  Edinburgh Postnatal Depression Scale Total 10      After visit meds:  Allergies as of 06/12/2022   No Known Allergies      Medication List     STOP taking these medications    prenatal multivitamin Tabs tablet       TAKE these medications    acetaminophen 325 MG tablet Commonly known as: TYLENOL Take 2 tablets (650 mg total) by mouth every 4 (four) hours as needed for mild pain.   ibuprofen 600 MG tablet Commonly known as: ADVIL Take 1 tablet (600 mg total) by mouth every 6 (six) hours.               Discharge Care Instructions  (From admission, onward)           Start     Ordered    06/12/22 0000  Discharge wound care:       Comments: SHOWER DAILY Wash incision gently with soap and water.  Call office with any drainage, redness, or firmness of the incision.   06/12/22 1028             Discharge home in stable condition Infant Feeding: Bottle Infant Disposition:rooming in Discharge instruction: per After Visit Summary and Postpartum booklet. Activity: Advance as tolerated. Pelvic rest for 6 weeks.  Diet: routine diet Anticipated Birth Control: Nexplanon Postpartum Appointment:2 weeks and 6 wks  Additional Postpartum F/U:  NA Future Appointments:No future appointments. Follow up Visit:  Follow-up Information     Lurlean Horns, CNM. Schedule an appointment as soon as possible for a visit.   Specialty: Obstetrics Why: Follow up in 2 weeks for a video visit and 6 weeks in the office Contact information: [place]. [place] [address] [phone]

## 2022-06-10 NOTE — Clinical Social Work Maternal (Signed)
CLINICAL SOCIAL WORK MATERNAL/CHILD NOTE  Patient Details  Name: Sabrina Edwards MRN: [id number] Date of Birth: 01/28/1996  Date:  06/10/2022  Clinical Social Worker Initiating Note:  Sabrina Clay RN BSN Case Manager Date/Time: Initiated:  06/10/22/1145     Child's Name:  Sabrina Edwards   Biological Parents:  Mother, Father   Need for Interpreter:  None   Reason for Referral:  Current Substance Use/Substance Use During Pregnancy     Address:  [place] [address]    Phone number:  [phone] (home)     Additional phone number: NA  Household Members/Support Persons (HM/SP):   Household Member/Support Person 1   HM/SP Name Relationship DOB or Age  HM/SP -[place] other 92  HM/SP -2        HM/SP -3        HM/SP -4        HM/SP -5        HM/SP -6        HM/SP -7        HM/SP -8          Natural Supports (not living in the home):  Parent, Immediate Family   Professional Supports:     Employment: Full-time   Type of Work: Sports coach in   Education:  [place] arranged:    Museum/gallery curator Resources:  Medicaid   Other Resources:  Physicist, medical  , Sabrina Edwards Considerations Which May Impact Care:  NA  Strengths:  Ability to meet basic needs  , Home prepared for child     Psychotropic Medications:         Pediatrician:       Pediatrician List:   [place]      Pediatrician Fax Number:    Risk Factors/Current Problems:  Substance Use     Cognitive State:  Goal Oriented  , Able to Concentrate  , Insightful     Mood/Affect:  Calm  , Comfortable     CSW Assessment: RNCM met with patient at the bedside, introduced self and explained reason for [facility] consult.  Patient had her significant other and his daughter at the bedside, they were asked to leave so we could talk privately.  Patient  verbalizes understanding of reason for visit, she is tearful that her UDS and the baby's UDS tested positive for amphetamines and marijuana.  She did not think that that the drug would still be in her system since she ingested the amphetamine over a week ago, she reports that she was feeling down and her cousin put something in her drink and gave it to her saying that it would make her feel better.  Patient denies regular methamphetamine use, she does report that she smokes marijuana regularly.  Informed patient that as required by law a report will be made to CPS.  Patient asks if the baby is okay and what will happen.  RNCM informed her that CPS would probably want to speak with her and may come out to assess the home, instructed her to just be honest with them.  The baby is not having any symptoms of withdrawal and is healthy.  Patient has the support of Sabrina Edwards her significant other they live together in [place] where they just moved to [place].  She also has the support of her mother and siblings.  She prefers that her significant other not know about this.    The home is prepared for the baby, they have all needed supplies.  Patient has reliable transportation, she has not chosen a Lexicographer but says she will probably try for [facility].  Provided the patient with information on Post Partum Depression along with a Post Partum Progress Sheet.  CPS report made.  RNCM will follow the Cord blood.      CSW Plan/Description:  Child Protective Service Report      Sabrina Hutching, RN 06/10/2022, 6:50 PM

## 2022-06-11 NOTE — Progress Notes (Signed)
Progress Note - Vaginal Delivery  Sabrina Edwards is a 26 y.o. G2P1011 now PP day 1 s/p Vaginal, Spontaneous .   Subjective:  The patient reports no complaints, up ad lib, voiding, and tolerating PO   Objective:  Vital signs in last 24 hours: Temp:  [97.7 F (36.5 C)-98.4 F (36.9 C)] 97.7 F (36.5 C) (07/20 1601) Pulse Rate:  [60-76] 60 (07/20 1601) Resp:  [18-20] 20 (07/20 1601) BP: (119-129)/(74-85) 125/85 (07/20 1601) SpO2:  [99 %-100 %] 100 % (07/20 0820)  Physical Exam:  General: alert, cooperative, appears stated age, and mild distress Lochia: appropriate Uterine Fundus: firm    Data Review Recent Labs    06/09/22 1944 06/10/22 0556  HGB 12.8 11.5*  HCT 37.2 33.5*    Assessment/Plan: Principal Problem:   Labor and delivery, indication for care   Plan for discharge tomorrow   -- Continue routine PP care.     Doreene Burke, CNM  06/11/2022 4:11 PM

## 2022-06-11 NOTE — Anesthesia Postprocedure Evaluation (Signed)
Anesthesia Post Note  Patient: Database administrator  Procedure(s) Performed: AN AD HOC LABOR EPIDURAL  Patient location during evaluation: Mother Baby Anesthesia Type: Epidural Level of consciousness: awake and alert Pain management: pain level controlled Vital Signs Assessment: post-procedure vital signs reviewed and stable Respiratory status: spontaneous breathing, nonlabored ventilation and respiratory function stable Cardiovascular status: stable Postop Assessment: no headache, no backache and epidural receding Anesthetic complications: no   No notable events documented.   Last Vitals:  Vitals:   06/10/22 1723 06/11/22 0035  BP: 129/74 119/75  Pulse: 61 63  Resp: 18 18  Temp: 36.6 C 36.9 C  SpO2: 99% 100%    Last Pain:  Vitals:   06/11/22 0259  TempSrc:   PainSc: 2                  Leyah Bocchino Lawerance Cruel

## 2022-06-11 NOTE — TOC Progression Note (Signed)
Transition of Care [facility]) - Progression Note    Patient Details  Name: Sabrina Edwards MRN: [id number] Date of Birth: [birth year]  Transition of Care [facility]) CM/SW Contact  Shelbie Hutching, RN Phone Number: 06/11/2022, 6:50 PM  Clinical Narrative:    CPS worker Earney Hamburg came and met with patient and baby today.  Baby is experiencing withdrawal symptoms.  CPS does not want the baby released from the hospital without them first being notified.          Expected Discharge Plan and Services                                                 Social Determinants of Health (SDOH) Interventions    Readmission Risk Interventions     No data to display

## 2022-06-12 MED ORDER — ACETAMINOPHEN 325 MG PO TABS: 650.0000 mg | ORAL_TABLET | ORAL | Status: DC | PRN

## 2022-06-12 MED ORDER — IBUPROFEN 600 MG PO TABS: 600.0000 mg | ORAL_TABLET | Freq: Four times a day (QID) | ORAL | 0 refills | Status: DC

## 2022-06-12 NOTE — Progress Notes (Signed)
Pt discharged, infant staying for Eat, Sleep and Console.Marland Kitchen  Discharge instructions, prescriptions and follow up appointment given to and reviewed with pt. Pt verbalized understanding.

## 2022-07-29 ENCOUNTER — Encounter: Payer: Medicaid Other | Admitting: Obstetrics

## 2022-11-23 NOTE — L&D Delivery Note (Signed)
Delivery Note   Sabrina Edwards is a 27 y.o. G3P1011 at [redacted]w[redacted]d Estimated Date of Delivery: 07/01/23  PRE-OPERATIVE DIAGNOSIS:  1) [redacted]w[redacted]d pregnancy.  2) Marijuana use in pregnancy  POST-OPERATIVE DIAGNOSIS:  1) [redacted]w[redacted]d pregnancy s/p Vaginal, Spontaneous   Delivery Type: Vaginal, Spontaneous   Delivery Anesthesia: Epidural  Labor Complications:  None    ESTIMATED BLOOD LOSS: 200 ml    FINDINGS:   1) female infant, Apgar scores of 8   at 1 minute and 9   at 5 minutes and a birthweight pending per protocol   SPECIMENS:   PLACENTA:   Appearance: Intact, multiple calcifications   Removal: Spontaneous     Disposition:  Discarded  CORD BLOOD: not collected  DISPOSITION:  Infant left in stable condition in the delivery room, with L&D personnel and mother,  NARRATIVE SUMMARY: Labor course:  Sabrina Edwards is a G3P1011 at [redacted]w[redacted]d who presented to Labor & Delivery for labor management. Her initial cervical exam was 6/80/-2. Labor proceeded spontaneously and she was found to be completely dilated at 2350. With excellent maternal pushing effort, she birthed a viable female infant at 19. There was not a nuchal cord. The shoulders were birthed without difficulty. The infant was placed skin-to-skin with mother. The cord was doubly clamped and cut when pulsations ceased. The placenta delivered spontaneously and was noted to be intact with a 3VC. A perineal and vaginal examination was performed. Episiotomy/Lacerations: 1st degree Lacerations were repaired with Vicryl suture using epidural anesthesia. The patient tolerated this well. Mother and baby were left in stable condition.   Dominica Severin, CNM 07/03/2023 12:28 AM

## 2022-12-25 ENCOUNTER — Ambulatory Visit (LOCAL_COMMUNITY_HEALTH_CENTER): Payer: Medicaid Other

## 2022-12-25 VITALS — BP 121/83 | Ht 69.0 in | Wt 138.0 lb

## 2022-12-25 DIAGNOSIS — Z3201 Encounter for pregnancy test, result positive: Secondary | ICD-10-CM

## 2022-12-25 MED ORDER — PRENATAL 27-0.8 MG PO TABS: 1.0000 | ORAL_TABLET | Freq: Every day | ORAL | 0 refills | Status: AC

## 2022-12-25 NOTE — Progress Notes (Addendum)
Client seen in nurse clinic for positive pregnancy test.  She reports she is not sure of her LMP. Perhaps Sept 2023. She  reported she did a home pregnancy in November that was positive.   She reported she had light bleeding in December, and when didn't get her period in January, came for pregnancy test. She had baby in  July of 2023.  They were not using any birth control.    Mother reported that she smokes 5-6 cigarettes.  Per day. She is trying to cut back on smoking.   She also reported that she also used on Marijuana gummy, about 1.5 weeks ago.  Counseled to no used Marijuana during pregnancy.    The patient was dispensed prenatal vitamins today. I provided counseling today regarding the medication. We discussed the medication, the side effects and when to call clinic. Patient given the opportunity to ask questions. Questions answered.   Client sent to cleric to schedule prenatal appointment.     Elon Lomeli Shelda Pal, RN

## 2022-12-30 LAB — PREGNANCY, URINE: Preg Test, Ur: POSITIVE — AB

## 2023-01-12 ENCOUNTER — Telehealth: Payer: Self-pay

## 2023-01-12 ENCOUNTER — Ambulatory Visit: Payer: Medicaid Other | Admitting: Advanced Practice Midwife

## 2023-01-12 VITALS — BP 133/71 | HR 85 | Temp 96.7°F | Ht 67.75 in | Wt 140.4 lb

## 2023-01-12 DIAGNOSIS — F17200 Nicotine dependence, unspecified, uncomplicated: Secondary | ICD-10-CM

## 2023-01-12 DIAGNOSIS — O169 Unspecified maternal hypertension, unspecified trimester: Secondary | ICD-10-CM | POA: Insufficient documentation

## 2023-01-12 DIAGNOSIS — O0992 Supervision of high risk pregnancy, unspecified, second trimester: Secondary | ICD-10-CM | POA: Insufficient documentation

## 2023-01-12 DIAGNOSIS — O99330 Smoking (tobacco) complicating pregnancy, unspecified trimester: Secondary | ICD-10-CM | POA: Insufficient documentation

## 2023-01-12 DIAGNOSIS — O0930 Supervision of pregnancy with insufficient antenatal care, unspecified trimester: Secondary | ICD-10-CM | POA: Insufficient documentation

## 2023-01-12 DIAGNOSIS — F1991 Other psychoactive substance use, unspecified, in remission: Secondary | ICD-10-CM | POA: Insufficient documentation

## 2023-01-12 DIAGNOSIS — O0932 Supervision of pregnancy with insufficient antenatal care, second trimester: Secondary | ICD-10-CM

## 2023-01-12 LAB — URINALYSIS
Bilirubin, UA: NEGATIVE
Glucose, UA: NEGATIVE
Ketones, UA: NEGATIVE
Leukocytes,UA: NEGATIVE
Nitrite, UA: NEGATIVE
Protein,UA: NEGATIVE
RBC, UA: NEGATIVE
Specific Gravity, UA: 1.02 (ref 1.005–1.030)
Urobilinogen, Ur: 0.2 mg/dL (ref 0.2–1.0)
pH, UA: 7.5 (ref 5.0–7.5)

## 2023-01-12 LAB — HEMOGLOBIN, FINGERSTICK: Hemoglobin: 12.2 g/dL (ref 11.1–15.9)

## 2023-01-12 NOTE — Progress Notes (Signed)
[facility]   INITIAL PRENATAL VISIT NOTE  Subjective:  Sabrina Edwards is a 27 y.o. SWF smoker [id number] (13 month old son) at 2w6dbeing [facility] today [facility]. She feels "it's crazy" about surprise pregnancy with condoms. 27yo employed FOB feels "good" about pregnancy; he has 2 children ([phone]  who live with their mother , and he is also the father of her 761 moold son; in 3 year supportive relationship. She is working 35 hours/wk and will start school PT in the Fall at [employer] Living with her mom and her 237yo brother and pt's son.   Unsure LMP maybe 08/05/22. Denies ER use or u/s this pregnancy. Has been taking Amoxicillin for a tooth that needs a root canal since 12/29/22. Last dental exam 01/05/23.  Finished 12th grade.  Current smoker 1/2-3/4 ppd. Last vaped 2 years ago. Last MJ 2 wks ago (gummy) and 06/10/22 smoked. Last meth age 27 Last oxycodone 2 years ago. Last Fentanyl 2 years ago.+UDS amphetamines 06/09/22.  Last ETOH 08/2022 (1 Margarita). Denies cigars.  Can't remember last pap, maybe age [age]but [place]  She is currently monitored for the following issues for this high-risk pregnancy and has Elevated blood pressure affecting pregnancy, antepartum 01/12/23=133/71; Supervision of high risk pregnancy in second trimester; Late prenatal care at 2814/7 (3 months); close interconceptual spacing (3 months); Smoker 1/2-3/4 ppd; and History of drug use (meth, Fentanyl, oxycodone) 2022 on their problem list.  Patient reports no complaints.   .  .   . Denies leaking of fluid.   Indications for ASA therapy (per uptodate) One of the following: Previous pregnancy with preeclampsia, especially early onset and with an adverse outcome No Multifetal gestation No Chronic hypertension No Type 1 or 2 diabetes mellitus No Chronic kidney disease No Autoimmune disease (antiphospholipid syndrome,  systemic lupus erythematosus) No  Two or more of the following: Nulliparity No Obesity (body mass index >30 kg/m2) No Family history of preeclampsia in mother or sister No Age ?35 years No Sociodemographic characteristics (African American race, low socioeconomic level) No Personal risk factors (eg, previous pregnancy with low birth weight or small for gestational age infant, previous adverse pregnancy outcome [eg, stillbirth], interval >10 years between pregnancies) No   The following portions of the patient's history were reviewed and updated as appropriate: allergies, current medications, past family history, past medical history, past social history, past surgical history and problem list. Problem list updated.  Objective:   Vitals:   01/12/23 0830 01/12/23 0832  BP: 133/71   Pulse: 85   Temp: (!) 96.7 F (35.9 C)   Weight: 140 lb 6.4 oz (63.7 kg)   Height:  5' 7.75" (1.721 m)    Fetal Status:           Pt refusing exam below the waist today despite counseling that this is most accurate way to date pregancy  Physical Exam Vitals and nursing note reviewed.  Constitutional:      General: She is not in acute distress.    Appearance: Normal appearance. She is well-developed and normal weight.  HENT:     Head: Normocephalic and atraumatic.     Right Ear: External ear normal.     Left Ear: External ear normal.     Nose: Nose normal. No congestion or rhinorrhea.     Mouth/Throat:     Lips:  Pink.     Mouth: Mucous membranes are moist.     Dentition: Normal dentition. No dental caries.     Pharynx: Oropharynx is clear. Uvula midline.     Comments: Dentition: poor with missing teeth and infected tooth; was given Amoxicillin 12/29/22 for infected tooth Eyes:     General: No scleral icterus.    Conjunctiva/sclera: Conjunctivae normal.  Neck:     Thyroid: No thyroid mass, thyromegaly or thyroid tenderness.  Cardiovascular:     Rate and Rhythm: Normal rate.     Pulses: Normal  pulses.     Comments: Extremities are warm and well perfused Pulmonary:     Effort: Pulmonary effort is normal.     Breath sounds: Normal breath sounds.  Chest:     Chest wall: No mass.  Breasts:    Tanner Score is 5.     Breasts are symmetrical.     Right: Normal. No mass, nipple discharge or skin change.     Left: Normal. No mass, nipple discharge or skin change.  Abdominal:     General: Abdomen is flat.     Palpations: Abdomen is soft.     Tenderness: There is no abdominal tenderness.     Comments: Gravid, soft without tenderness, 19-20 wks size, FHR=150  Genitourinary:    Pubic Area: No rash.      Labia:        Right: No rash.        Left: No rash.      Vagina: No vaginal discharge.     Cervix: No cervical motion tenderness or friability.     Uterus: Enlarged (Gravid pt is refusing bimanual exam, speculum exam, wet mount, pap today). Not tender.      Rectum: No external hemorrhoid.     Comments: Pt is refusing bimanual, speculum exam, wet mount, pap today Musculoskeletal:     Right lower leg: No edema.     Left lower leg: No edema.  Lymphadenopathy:     Cervical: No cervical adenopathy.     Upper Body:     Right upper body: No axillary adenopathy.     Left upper body: No axillary adenopathy.  Skin:    General: Skin is warm.     Capillary Refill: Capillary refill takes less than 2 seconds.  Neurological:     Mental Status: She is alert.    Assessment and Plan:  Pregnancy: G3P1011 at [redacted]w[redacted]d 1. Elevated blood pressure affecting pregnancy, antepartum 01/12/23=133/71 Repeat=117/67 To monitor  2. Supervision of high risk pregnancy in second trimester Counseled on weight gain of 25-35 lbs this pregnancy Pt desires NIPS today but refusing bimanual/speculum exam today so unable to accurately assess dating of pregnancy so cancelled Please give pt dental list and encourage exam asap Pt counseled at next apt will need pap, wet mount, bimanual exam, possibly GC/Chlamydia  (if pt unable to give urine sample at NOB)--pt did not give enough urine so need to do at next apt  - QuantiFERON-TB Gold Plus - Prenatal Profile I -[id number] Screen - Hgb A1c w/o eAG - TSH + free T4 - Urinalysis (Urine Dip) - Hemoglobin, venipuncture - [place]FM OB COMP + 14 WK; Future  3. Late prenatal care at 2284/7   4. close interconceptual spacing 3 mo   5. Smoker 1/2-3/4 ppd Counseled via 5 A's to stop smoking  6. History of drug use (meth, Fentanyl, oxycodone) 2022 Agrees to UDS today; pt states she is  able to not use and doesn't need assistance with cessation    Discussed overview of care and coordination with inpatient delivery practices including [facility],  [place].   Reviewed Centering pregnancy as standard of care at [facility]   Preterm labor symptoms and general obstetric precautions including but not limited to vaginal bleeding, contractions, leaking of fluid and fetal movement were reviewed in detail with the patient.  Please refer to After Visit Summary for other counseling recommendations.   Return in about 2 weeks (around 01/26/2023) for pap, bimanual, wet mount, routine PNC.  Future Appointments  Date Time Provider [place]  01/26/2023  8:40 AM [facility] PROVIDER [facility] None    Herbie Saxon, CNM

## 2023-01-12 NOTE — Telephone Encounter (Signed)
Patient given instructions, date, and location of U/S scheduled for 03/26 at 0800. Aware that she needs to arrive at [place]. BThiele RN

## 2023-01-12 NOTE — Progress Notes (Addendum)
Repeat BP at 1000 117/67 and HR 109. In [employer] (urine dip and Hgb) reviewed during visit. Unable to perform MaterniT21 - unknown gestational age. Will need to await until exam/ultrasound performed-provider aware.  Not enough urine sample for drug screen -provider aware. BThiele RN

## 2023-01-15 LAB — PREGNANCY, INITIAL SCREEN
Antibody Screen: NEGATIVE
Basophils Absolute: 0.1 10*3/uL (ref 0.0–0.2)
Basos: 1 %
Bilirubin, UA: NEGATIVE
Chlamydia trachomatis, NAA: NEGATIVE
EOS (ABSOLUTE): 0.2 10*3/uL (ref 0.0–0.4)
Eos: 2 %
Glucose, UA: NEGATIVE
HCV Ab: NONREACTIVE
HIV Screen 4th Generation wRfx: NONREACTIVE
Hematocrit: 34.6 % (ref 34.0–46.6)
Hemoglobin: 11.7 g/dL (ref 11.1–15.9)
Hepatitis B Surface Ag: NEGATIVE
Immature Grans (Abs): 0.1 10*3/uL (ref 0.0–0.1)
Immature Granulocytes: 1 %
Ketones, UA: NEGATIVE
Leukocytes,UA: NEGATIVE
Lymphocytes Absolute: 2.1 10*3/uL (ref 0.7–3.1)
Lymphs: 18 %
MCH: 31.4 pg (ref 26.6–33.0)
MCHC: 33.8 g/dL (ref 31.5–35.7)
MCV: 93 fL (ref 79–97)
Monocytes Absolute: 0.7 10*3/uL (ref 0.1–0.9)
Monocytes: 6 %
Neisseria Gonorrhoeae by PCR: NEGATIVE
Neutrophils Absolute: 8.5 10*3/uL — ABNORMAL HIGH (ref 1.4–7.0)
Neutrophils: 72 %
Nitrite, UA: NEGATIVE
Platelets: 277 10*3/uL (ref 150–450)
Protein,UA: NEGATIVE
RBC, UA: NEGATIVE
RBC: 3.73 x10E6/uL — ABNORMAL LOW (ref 3.77–5.28)
RDW: 13.1 % (ref 11.7–15.4)
RPR Ser Ql: NONREACTIVE
Rh Factor: POSITIVE
Rubella Antibodies, IGG: 1.84 index (ref >0.99)
Specific Gravity, UA: 1.018 (ref 1.005–1.030)
Urobilinogen, Ur: 0.2 mg/dL (ref 0.2–1.0)
WBC: 11.6 10*3/uL — ABNORMAL HIGH (ref 3.4–10.8)
pH, UA: 7.5 (ref 5.0–7.5)

## 2023-01-15 LAB — QUANTIFERON-TB GOLD PLUS
QuantiFERON Mitogen Value: >10 IU/mL
QuantiFERON Nil Value: 0 IU/mL
QuantiFERON TB1 Ag Value: 0.01 IU/mL
QuantiFERON TB2 Ag Value: 0.02 IU/mL
QuantiFERON-TB Gold Plus: NEGATIVE

## 2023-01-15 LAB — URINE CULTURE, OB REFLEX

## 2023-01-15 LAB — HGB A1C W/O EAG: Hgb A1c MFr Bld: 5.3 % (ref 4.8–5.6)

## 2023-01-15 LAB — TSH+FREE T4
Free T4: 1.05 ng/dL (ref 0.82–1.77)
TSH: 1.19 u[IU]/mL (ref 0.450–4.500)

## 2023-01-15 LAB — HCV INTERPRETATION

## 2023-01-15 LAB — MICROSCOPIC EXAMINATION
Bacteria, UA: NONE SEEN
Casts: NONE SEEN /lpf
Epithelial Cells (non renal): NONE SEEN /hpf (ref 0–10)
RBC, Urine: NONE SEEN /hpf (ref 0–2)
WBC, UA: NONE SEEN /hpf (ref 0–5)

## 2023-01-26 ENCOUNTER — Ambulatory Visit: Payer: Medicaid Other

## 2023-01-26 ENCOUNTER — Telehealth: Payer: Self-pay

## 2023-01-26 NOTE — Telephone Encounter (Signed)
Call to client to reschedule missed [facility] appt on 01/26/2023. Left message to call and number to call provided. Rich Number, RN

## 2023-01-27 NOTE — Telephone Encounter (Signed)
Call to patient to reschedule missed [facility] RV appt on 01/26/2023. Left message to call and number to call provided [phone].   Al Decant, RN

## 2023-01-28 NOTE — Telephone Encounter (Signed)
Call to patient to reschedule missed MHC RV appt on 01/26/2023. Left message to call and number to call provided [phone].    Call to emergency contact, Abigail Butts. Informed Abigail Butts to have Chaya Jan give [facility] appointment line a call at [phone] to reschedule missed maternity appointment. Abigail Butts verbalized she will let patient know.   Al Decant, RN

## 2023-02-01 NOTE — Telephone Encounter (Signed)
Call to client and recently missed MHC RV appt rescheduled for 02/08/2023 at 1400. Client counseled to arrive at 1345. Rich Number, RN

## 2023-02-03 NOTE — Addendum Note (Signed)
Addended by: Donnal Moat on: 02/03/2023 08:54 AM   Modules accepted: Orders

## 2023-02-08 ENCOUNTER — Ambulatory Visit: Payer: Medicaid Other | Admitting: Family Medicine

## 2023-02-08 VITALS — BP 116/69 | HR 91 | Temp 97.7°F | Wt 146.6 lb

## 2023-02-08 DIAGNOSIS — F1991 Other psychoactive substance use, unspecified, in remission: Secondary | ICD-10-CM

## 2023-02-08 DIAGNOSIS — F17200 Nicotine dependence, unspecified, uncomplicated: Secondary | ICD-10-CM

## 2023-02-08 DIAGNOSIS — O162 Unspecified maternal hypertension, second trimester: Secondary | ICD-10-CM

## 2023-02-08 DIAGNOSIS — O169 Unspecified maternal hypertension, unspecified trimester: Secondary | ICD-10-CM

## 2023-02-08 DIAGNOSIS — O0992 Supervision of high risk pregnancy, unspecified, second trimester: Secondary | ICD-10-CM

## 2023-02-08 LAB — URINALYSIS
Bilirubin, UA: NEGATIVE
Glucose, UA: NEGATIVE
Ketones, UA: NEGATIVE
Nitrite, UA: NEGATIVE
Protein,UA: NEGATIVE
RBC, UA: NEGATIVE
Specific Gravity, UA: 1.02 (ref 1.005–1.030)
Urobilinogen, Ur: 0.2 mg/dL (ref 0.2–1.0)
pH, UA: 7 (ref 5.0–7.5)

## 2023-02-08 NOTE — Progress Notes (Signed)
[place] [facility]  PRENATAL VISIT NOTE  Subjective:  Sabrina Edwards is a 27 y.o. G3P1011 at [redacted]w[redacted]d being seen today for ongoing prenatal care.  She is currently monitored for the following issues for this high-risk pregnancy and has Elevated blood pressure affecting pregnancy, antepartum 01/12/23=133/71; Supervision of high risk pregnancy in second trimester; Late prenatal care at 70 4/7 (3 months); close interconceptual spacing (3 months); Smoker 1/2-3/4 ppd; and History of drug use (meth, Fentanyl, oxycodone) 2022 on their problem list.  Patient reports no complaints.  Contractions: Not present. Vag. Bleeding: None.  Movement: Present. Denies leaking of fluid/ROM.   The following portions of the patient's history were reviewed and updated as appropriate: allergies, current medications, past family history, past medical history, past social history, past surgical history and problem list. Problem list updated.  Objective:   Vitals:   02/08/23 1357  BP: 116/69  Pulse: 91  Temp: 97.7 F (36.5 C)  Weight: 146 lb 9.6 oz (66.5 kg)    Fetal Status: Fetal Heart Rate (bpm): 24 Fundal Height: 149 cm Movement: Present     General:  Alert, oriented and cooperative. Patient is in no acute distress.  Skin: Skin is warm and dry. No rash noted.   Cardiovascular: Normal heart rate noted  Respiratory: Normal respiratory effort, no problems with respiration noted  Abdomen: Soft, gravid, appropriate for gestational age.  Pain/Pressure: Absent     Pelvic: Cervical exam deferred        Extremities: Normal range of motion.  Edema: None  Mental Status: Normal mood and affect. Normal behavior. Normal judgment and thought content.   Assessment and Plan:  Pregnancy: G3P1011 at [redacted]w[redacted]d  1. Supervision of high risk pregnancy in second trimester -pt states she had pap at 40, declined pap today, stating "I had a friend who had one and then she had a stillbirth", discussed  why pap smears are important, does not want pap testing while pregnant- agrees to do it postpartum -denied itching, discharge- no indication for wet mount and patient declined pelvic exam/swabs -NIPS not done at initial prenatal, declined today  -[place] on 02/15/23 with [facility] 16 lb 9.6 oz (7.53 kg) -exercise- about 1x/week -works about 40 hours a week  -reviewed urine dip= wnl  - Urinalysis 2. Elevated blood pressure affecting pregnancy, antepartum 01/12/23=133/71 -BP today stable -116/69, continue to monitor  3. Smoker 1/2-3/4 ppd -still smoking this amt about 10 cigarettes a day- has not been able to quit but is working on decreasing cigarette smoking amt  4. History of drug use (meth, Fentanyl, oxycodone) 2022 -repeat UDS today, not enough urine at initial appointment  - [phone] 7+Oxycodone-Bund  Preterm labor symptoms and general obstetric precautions including but not limited to vaginal bleeding, contractions, leaking of fluid and fetal movement were reviewed in detail with the patient. Please refer to After Visit Summary for other counseling recommendations.  Return in about 2 weeks (around 02/22/2023) for Routine Prenatal Care.  Future Appointments  Date Time Provider [place]  02/15/2023  8:00 AM [facility]  02/22/2023  3:30 PM [facility] PROVIDER [facility] None    Sharlet Salina, FNP

## 2023-02-08 NOTE — Progress Notes (Signed)
Pt here for MHRV 26w 5d. Pt refused pap, wet mount and offered M21, pt declined M21.  Urinalysis reviewed in clinic by provider, no treatment indicated.  Harland Dingwall, RN

## 2023-02-12 LAB — 789231 7+OXYCODONE-BUND
Amphetamines, Urine: NEGATIVE ng/mL
BENZODIAZ UR QL: NEGATIVE ng/mL
Barbiturate screen, urine: NEGATIVE ng/mL
Cocaine (Metab.): NEGATIVE ng/mL
OPIATE SCREEN URINE: NEGATIVE ng/mL
Oxycodone/Oxymorphone, Urine: NEGATIVE ng/mL
PCP Quant, Ur: NEGATIVE ng/mL

## 2023-02-12 LAB — CANNABINOID CONFIRMATION, UR
CANNABINOIDS: POSITIVE — AB
Carboxy THC GC/MS Conf: 155 ng/mL

## 2023-02-15 ENCOUNTER — Other Ambulatory Visit: Payer: Self-pay | Admitting: Advanced Practice Midwife

## 2023-02-15 ENCOUNTER — Ambulatory Visit: Payer: Medicaid Other | Attending: Maternal & Fetal Medicine

## 2023-02-15 ENCOUNTER — Other Ambulatory Visit: Payer: Self-pay

## 2023-02-15 ENCOUNTER — Encounter: Payer: Self-pay | Admitting: Family Medicine

## 2023-02-15 ENCOUNTER — Encounter: Payer: Self-pay | Admitting: Advanced Practice Midwife

## 2023-02-15 DIAGNOSIS — O26892 Other specified pregnancy related conditions, second trimester: Secondary | ICD-10-CM | POA: Diagnosis not present

## 2023-02-15 DIAGNOSIS — F1991 Other psychoactive substance use, unspecified, in remission: Secondary | ICD-10-CM

## 2023-02-15 DIAGNOSIS — O99332 Smoking (tobacco) complicating pregnancy, second trimester: Secondary | ICD-10-CM | POA: Diagnosis not present

## 2023-02-15 DIAGNOSIS — O0932 Supervision of pregnancy with insufficient antenatal care, second trimester: Secondary | ICD-10-CM | POA: Insufficient documentation

## 2023-02-15 DIAGNOSIS — O0992 Supervision of high risk pregnancy, unspecified, second trimester: Secondary | ICD-10-CM

## 2023-02-15 DIAGNOSIS — Z363 Encounter for antenatal screening for malformations: Secondary | ICD-10-CM | POA: Diagnosis not present

## 2023-02-15 DIAGNOSIS — Z3687 Encounter for antenatal screening for uncertain dates: Secondary | ICD-10-CM | POA: Insufficient documentation

## 2023-02-15 DIAGNOSIS — F17200 Nicotine dependence, unspecified, uncomplicated: Secondary | ICD-10-CM

## 2023-02-15 DIAGNOSIS — Z3A20 20 weeks gestation of pregnancy: Secondary | ICD-10-CM | POA: Diagnosis not present

## 2023-02-15 DIAGNOSIS — O09292 Supervision of pregnancy with other poor reproductive or obstetric history, second trimester: Secondary | ICD-10-CM | POA: Diagnosis not present

## 2023-02-15 DIAGNOSIS — O0930 Supervision of pregnancy with insufficient antenatal care, unspecified trimester: Secondary | ICD-10-CM

## 2023-02-15 DIAGNOSIS — R825 Elevated urine levels of drugs, medicaments and biological substances: Secondary | ICD-10-CM | POA: Insufficient documentation

## 2023-02-15 DIAGNOSIS — O169 Unspecified maternal hypertension, unspecified trimester: Secondary | ICD-10-CM

## 2023-02-16 ENCOUNTER — Ambulatory Visit: Payer: Medicaid Other

## 2023-02-22 ENCOUNTER — Telehealth: Payer: Self-pay

## 2023-02-22 ENCOUNTER — Ambulatory Visit: Payer: Medicaid Other

## 2023-02-22 NOTE — Telephone Encounter (Signed)
Unable to reach emergency contact to reschedule patient's Prenatal Follow Up visit. BTHIELE RN

## 2023-02-22 NOTE — Telephone Encounter (Signed)
Patient late for today's Prenatal appointment. Unable to leave phone message (mailbox full).  Unable to reschedule upcoming appointment. BTHIELE RN

## 2023-02-23 NOTE — Telephone Encounter (Signed)
Call to client to reschedule missed MHC RV appt. Per recorded message, voicemail box is full. Call to emergency contact and immediate beeping sound heard. Rich Number, RN

## 2023-02-26 NOTE — Telephone Encounter (Signed)
Call to client and per recorded message, voicemail box is full.  Call to mother (emergency contact) who states client is not there, but provided work # of [phone]. Requested mother assist [place] in having Sabrina Edwards call [facility] for an appt.  Call to work number and per recorded message, number reached is [phone]. Following message left: "please have Sabrina Edwards call her health care provider and this number was obtained from her emergency contact". Jossie Ng, RN

## 2023-03-01 ENCOUNTER — Telehealth: Payer: Self-pay

## 2023-03-01 NOTE — Telephone Encounter (Signed)
Called patient at work #. Scheduled return OB visit. BTHIELE RN

## 2023-03-08 ENCOUNTER — Ambulatory Visit: Payer: Medicaid Other | Admitting: Family Medicine

## 2023-03-17 ENCOUNTER — Ambulatory Visit: Payer: Medicaid Other

## 2023-03-19 NOTE — Progress Notes (Signed)
Erroneous

## 2023-03-22 ENCOUNTER — Encounter: Payer: Medicaid Other | Admitting: Family Medicine

## 2023-03-22 ENCOUNTER — Telehealth: Payer: Self-pay

## 2023-03-22 DIAGNOSIS — O169 Unspecified maternal hypertension, unspecified trimester: Secondary | ICD-10-CM

## 2023-03-22 DIAGNOSIS — F17200 Nicotine dependence, unspecified, uncomplicated: Secondary | ICD-10-CM

## 2023-03-22 DIAGNOSIS — O0992 Supervision of high risk pregnancy, unspecified, second trimester: Secondary | ICD-10-CM

## 2023-03-22 DIAGNOSIS — R825 Elevated urine levels of drugs, medicaments and biological substances: Secondary | ICD-10-CM

## 2023-03-22 DIAGNOSIS — Z3A25 25 weeks gestation of pregnancy: Secondary | ICD-10-CM

## 2023-03-22 NOTE — Telephone Encounter (Signed)
Patient did not arrive to [facility] appointment today. Left message on patients phone to call [facility] department to schedule prenatal visit at [phone]. BTHIELE RN

## 2023-03-24 ENCOUNTER — Telehealth: Payer: Self-pay

## 2023-03-24 NOTE — Telephone Encounter (Signed)
Patient called to schedule return OB visit. BTHIELE RN

## 2023-03-24 NOTE — Telephone Encounter (Signed)
Left message for patient to call [facility] at [phone] to schedule return OB visit. BTHIELE RN

## 2023-03-24 NOTE — Telephone Encounter (Signed)
LM on VM for patient to call and schedule RV-AC/FU

## 2023-04-01 ENCOUNTER — Ambulatory Visit: Payer: Medicaid Other | Admitting: Physician Assistant

## 2023-04-01 ENCOUNTER — Encounter: Payer: Self-pay | Admitting: Physician Assistant

## 2023-04-01 ENCOUNTER — Ambulatory Visit: Payer: Medicaid Other

## 2023-04-01 VITALS — BP 115/62 | HR 111 | Temp 97.3°F | Wt 154.8 lb

## 2023-04-01 DIAGNOSIS — O0993 Supervision of high risk pregnancy, unspecified, third trimester: Secondary | ICD-10-CM

## 2023-04-01 DIAGNOSIS — Z3A27 27 weeks gestation of pregnancy: Secondary | ICD-10-CM

## 2023-04-01 DIAGNOSIS — O0930 Supervision of pregnancy with insufficient antenatal care, unspecified trimester: Secondary | ICD-10-CM

## 2023-04-01 DIAGNOSIS — R825 Elevated urine levels of drugs, medicaments and biological substances: Secondary | ICD-10-CM

## 2023-04-01 DIAGNOSIS — O0933 Supervision of pregnancy with insufficient antenatal care, third trimester: Secondary | ICD-10-CM

## 2023-04-01 DIAGNOSIS — F17200 Nicotine dependence, unspecified, uncomplicated: Secondary | ICD-10-CM

## 2023-04-01 DIAGNOSIS — O0992 Supervision of high risk pregnancy, unspecified, second trimester: Secondary | ICD-10-CM

## 2023-04-01 NOTE — Progress Notes (Signed)
[facility]  PRENATAL VISIT NOTE  Subjective:  Sabrina Edwards is a 27 y.o. G3P1011 at [redacted]w[redacted]d being seen today for ongoing prenatal care.  She is currently monitored for the following issues for this high-risk pregnancy and has Elevated blood pressure affecting pregnancy, antepartum 01/12/23=133/71; Supervision of high risk pregnancy in second trimester; Late prenatal care at 22 4/7 (3 months); close interconceptual spacing (3 months); Smoker 1/2-3/4 ppd; History of drug use (meth, Fentanyl, oxycodone) 2022; and Positive urine drug screen 02/08/23 + MJ on their problem list.  Patient reports no complaints.  Contractions: Not present. Vag. Bleeding: None.  Movement: Present. Denies leaking of fluid/ROM.   The following portions of the patient's history were reviewed and updated as appropriate: allergies, current medications, past family history, past medical history, past social history, past surgical history and problem list. Problem list updated.  Objective:   Vitals:   04/01/23 1336  BP: 115/62  Pulse: (!) 111  Temp: (!) 97.3 F (36.3 C)  Weight: 154 lb 12.8 oz (70.2 kg)    Fetal Status: Fetal Heart Rate (bpm): 144 Fundal Height: 29 cm Movement: Present     General:  Alert, oriented and cooperative. Patient is in no acute distress.  Skin: Skin is warm and dry. No rash noted.   Cardiovascular: Normal heart rate noted  Respiratory: Normal respiratory effort, no problems with respiration noted  Abdomen: Soft, gravid, appropriate for gestational age.  Pain/Pressure: Absent     Pelvic: Cervical exam deferred        Extremities: Normal range of motion.  Edema: None  Mental Status: Normal mood and affect. Normal behavior. Normal judgment and thought content.   Assessment and Plan:  Pregnancy: G3P1011 at [redacted]w[redacted]d  1. Supervision of high risk pregnancy in second trimester Enc to keep fetal US as sched 04/12/23. EPDS score = 0 today.  2. [redacted] weeks  gestation of pregnancy Return in 2 weeks for 28 week labs.  3. Positive urine drug screen 02/08/23 + MJ OBCM referral done; Plan of Safe Care oral/written info given.  4. Smoker 1/2-3/4 ppd Enc cessation.   Preterm labor symptoms and general obstetric precautions including but not limited to vaginal bleeding, contractions, leaking of fluid and fetal movement were reviewed in detail with the patient. Please refer to After Visit Summary for other counseling recommendations.  Return in about 2 weeks (around 04/15/2023) for Routine prenatal care, 28 wk labs (before 10:30am or 3pm).  Future Appointments  Date Time Provider Department Center  04/12/2023  9:00 AM [facility]  04/15/2023  9:00 AM [facility] PROVIDER AC-MAT None    Landry Dyke, PA-C

## 2023-04-01 NOTE — Progress Notes (Addendum)
Verified client mobile / work number / contact number. Aware of 04/12/23 [facility] [place] apt at 0900. Counseled regarding Tdap recommendation in pregnancy and vaccine declined. Due to 52 month old with client today, decliend 28 week labs. Per A. Streilein PA-C, that is acceptable. 2 week MHC RV appt scheduled with reminder card given. Verified client address / phone number / contact name and number. Jossie Ng, RN

## 2023-04-07 ENCOUNTER — Other Ambulatory Visit: Payer: Self-pay

## 2023-04-07 DIAGNOSIS — O99333 Smoking (tobacco) complicating pregnancy, third trimester: Secondary | ICD-10-CM

## 2023-04-07 DIAGNOSIS — O0930 Supervision of pregnancy with insufficient antenatal care, unspecified trimester: Secondary | ICD-10-CM

## 2023-04-07 DIAGNOSIS — F1991 Other psychoactive substance use, unspecified, in remission: Secondary | ICD-10-CM

## 2023-04-12 ENCOUNTER — Ambulatory Visit: Payer: Medicaid Other | Attending: Obstetrics and Gynecology

## 2023-04-12 ENCOUNTER — Other Ambulatory Visit: Payer: Self-pay

## 2023-04-12 VITALS — BP 119/77 | HR 105 | Temp 98.2°F | Ht 68.0 in | Wt 156.0 lb

## 2023-04-12 DIAGNOSIS — R825 Elevated urine levels of drugs, medicaments and biological substances: Secondary | ICD-10-CM

## 2023-04-12 DIAGNOSIS — Z3A28 28 weeks gestation of pregnancy: Secondary | ICD-10-CM | POA: Insufficient documentation

## 2023-04-12 DIAGNOSIS — O09293 Supervision of pregnancy with other poor reproductive or obstetric history, third trimester: Secondary | ICD-10-CM

## 2023-04-12 DIAGNOSIS — O99333 Smoking (tobacco) complicating pregnancy, third trimester: Secondary | ICD-10-CM | POA: Diagnosis not present

## 2023-04-12 DIAGNOSIS — O0933 Supervision of pregnancy with insufficient antenatal care, third trimester: Secondary | ICD-10-CM | POA: Insufficient documentation

## 2023-04-12 DIAGNOSIS — O0930 Supervision of pregnancy with insufficient antenatal care, unspecified trimester: Secondary | ICD-10-CM

## 2023-04-12 DIAGNOSIS — F1991 Other psychoactive substance use, unspecified, in remission: Secondary | ICD-10-CM

## 2023-04-12 DIAGNOSIS — O0992 Supervision of high risk pregnancy, unspecified, second trimester: Secondary | ICD-10-CM

## 2023-04-15 ENCOUNTER — Encounter: Payer: Medicaid Other | Admitting: Physician Assistant

## 2023-04-15 ENCOUNTER — Telehealth: Payer: Self-pay

## 2023-04-15 NOTE — Progress Notes (Addendum)
Patient was in waiting area and has apparently left the building without being seen in maternity clinic. TC to patient, no answer, no VM. TC to patient work number, patient not at work, but woman who answered offered to tell [place] to call her Dr. Isidore Moos.Burt Knack, RN

## 2023-04-15 NOTE — Telephone Encounter (Signed)
Patient left this morning without being seen. TC to patient, unable to leave VM and VM not set up. TC to work number and person who answered offered to let patient know to call [facility].    Note from clerical that patient had called back. TC to patient who answered and states she has a new phone and doesn't know how to set up VM. Patient counseled to go back to place she bought her phone and have them help her set up her VM. Patient states she can come tomorrow first thing in the morning for her 28 week labs. Patient counseled that she will be overbooked and that she may have to wait. Patient states understanding. Burt Knack, RN

## 2023-04-16 ENCOUNTER — Ambulatory Visit: Payer: Medicaid Other | Admitting: Advanced Practice Midwife

## 2023-04-16 VITALS — BP 119/71 | HR 98 | Temp 97.5°F | Wt 159.6 lb

## 2023-04-16 DIAGNOSIS — O0992 Supervision of high risk pregnancy, unspecified, second trimester: Secondary | ICD-10-CM

## 2023-04-16 DIAGNOSIS — O0930 Supervision of pregnancy with insufficient antenatal care, unspecified trimester: Secondary | ICD-10-CM

## 2023-04-16 DIAGNOSIS — R825 Elevated urine levels of drugs, medicaments and biological substances: Secondary | ICD-10-CM

## 2023-04-16 DIAGNOSIS — O0993 Supervision of high risk pregnancy, unspecified, third trimester: Secondary | ICD-10-CM

## 2023-04-16 DIAGNOSIS — F1991 Other psychoactive substance use, unspecified, in remission: Secondary | ICD-10-CM

## 2023-04-16 DIAGNOSIS — F17200 Nicotine dependence, unspecified, uncomplicated: Secondary | ICD-10-CM | POA: Diagnosis not present

## 2023-04-16 DIAGNOSIS — O0933 Supervision of pregnancy with insufficient antenatal care, third trimester: Secondary | ICD-10-CM

## 2023-04-16 LAB — HEMOGLOBIN, FINGERSTICK: Hemoglobin: 12 g/dL (ref 11.1–15.9)

## 2023-04-16 NOTE — Progress Notes (Signed)
[facility]  PRENATAL VISIT NOTE  Subjective:  Sabrina Edwards is a 27 y.o. G3P1011 at [redacted]w[redacted]d being seen today for ongoing prenatal care.  She is currently monitored for the following issues for this high-risk pregnancy and has Elevated blood pressure affecting pregnancy, antepartum 01/12/23=133/71; Supervision of high risk pregnancy in second trimester; Late prenatal care at 51 4/7 (3 months); close interconceptual spacing (3 months); Smoker 1/2-3/4 ppd; History of drug use (meth, Fentanyl, oxycodone) 2022; and Positive urine drug screen 02/08/23 + MJ on their problem list.  Patient reports no complaints.  Contractions: Not present. Vag. Bleeding: None.  Movement: Present. Denies leaking of fluid/ROM.   The following portions of the patient's history were reviewed and updated as appropriate: allergies, current medications, past family history, past medical history, past social history, past surgical history and problem list. Problem list updated.  Objective:   Vitals:   04/16/23 0819  BP: 119/71  Pulse: 98  Temp: (!) 97.5 F (36.4 C)  Weight: 159 lb 9.6 oz (72.4 kg)    Fetal Status: Fetal Heart Rate (bpm): 138 Fundal Height: 29 cm Movement: Present     General:  Alert, oriented and cooperative. Patient is in no acute distress.  Skin: Skin is warm and dry. No rash noted.   Cardiovascular: Normal heart rate noted  Respiratory: Normal respiratory effort, no problems with respiration noted  Abdomen: Soft, gravid, appropriate for gestational age.  Pain/Pressure: Absent     Pelvic: Cervical exam deferred        Extremities: Normal range of motion.  Edema: None  Mental Status: Normal mood and affect. Normal behavior. Normal judgment and thought content.   Assessment and Plan:  Pregnancy: G3P1011 at [redacted]w[redacted]d  1. 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 Smoker 1/2-3/4 ppd Smoking 1/2-3/4 ppd  Counseled via 5 A's to stop smoking  3. Late prenatal care at 22 4/7 (3 months)   4. Positive urine drug screen 02/08/23 + MJ Last CBD gummy 2 wks ago because she had "a bug with N&V" for 2-3 days and couldn't keep anything down. Counseled not to use Plan UDS next apt  5. close interconceptual spacing (3 months)  6. History of drug use (meth, Fentanyl, oxycodone) 2022 Denies use   Preterm labor symptoms and general obstetric precautions including but not limited to vaginal bleeding, contractions, leaking of fluid and fetal movement were reviewed in detail with the patient. Please refer to After Visit Summary for other counseling recommendations.  Return in about 2 weeks (around 04/30/2023) for routine PNC.  Future Appointments  Date Time Provider Department Center  05/24/2023 11:00 AM [facility]    Alberteen Spindle, CNM

## 2023-04-16 NOTE — Progress Notes (Addendum)
Kept 04/12/23 [facility] [place] appt and aware of next scheduled [place] on 78/1/24 at 1100. Counseled on Tdap recommendation in pregnancy and vaccine declined. 28 week labs today. Jossie Ng, RN Client left without returning to clinic with hgb results. Per Neysa Bonito in [facility] lab, hgb today = 12.0 and no intervention required per standing order. Jossie Ng, RN

## 2023-04-17 LAB — GLUCOSE, 1 HOUR GESTATIONAL: Gestational Diabetes Screen: 71 mg/dL (ref 70–139)

## 2023-04-17 LAB — RPR: RPR Ser Ql: NONREACTIVE

## 2023-04-18 LAB — HIV-1/HIV-2 QUALITATIVE RNA
HIV-1 RNA, Qualitative: NONREACTIVE
HIV-2 RNA, Qualitative: NONREACTIVE

## 2023-04-28 ENCOUNTER — Telehealth: Payer: Self-pay

## 2023-04-28 ENCOUNTER — Ambulatory Visit: Payer: Medicaid Other

## 2023-04-28 NOTE — Telephone Encounter (Signed)
[facility] for MHC RV 04/28/23. Call to client and left message requesting she call and reschedule appt. Number to call provided. Jossie Ng, RN

## 2023-04-28 NOTE — Telephone Encounter (Signed)
Return call from client and [facility] RV appt scheduled for 05/04/2023. Jossie Ng, RN

## 2023-05-04 ENCOUNTER — Ambulatory Visit: Payer: Medicaid Other | Admitting: Family Medicine

## 2023-05-04 VITALS — BP 118/62 | HR 100 | Wt 160.8 lb

## 2023-05-04 DIAGNOSIS — R825 Elevated urine levels of drugs, medicaments and biological substances: Secondary | ICD-10-CM

## 2023-05-04 DIAGNOSIS — F17200 Nicotine dependence, unspecified, uncomplicated: Secondary | ICD-10-CM

## 2023-05-04 DIAGNOSIS — O0993 Supervision of high risk pregnancy, unspecified, third trimester: Secondary | ICD-10-CM

## 2023-05-04 DIAGNOSIS — O0933 Supervision of pregnancy with insufficient antenatal care, third trimester: Secondary | ICD-10-CM

## 2023-05-04 DIAGNOSIS — O0930 Supervision of pregnancy with insufficient antenatal care, unspecified trimester: Secondary | ICD-10-CM

## 2023-05-04 DIAGNOSIS — O0992 Supervision of high risk pregnancy, unspecified, second trimester: Secondary | ICD-10-CM

## 2023-05-04 NOTE — Progress Notes (Signed)
Patient here for MH RV at 31 5/7. Kick counts reviewed with client, 3 cards given. Aware of 05/24/23 [facility] U/S in [place].Burt Knack, RN

## 2023-05-04 NOTE — Progress Notes (Signed)
[facility]  PRENATAL VISIT NOTE  Subjective:  Sabrina Edwards is a 27 y.o. G3P1011 at [redacted]w[redacted]d being seen today for ongoing prenatal care.  She is currently monitored for the following issues for this high-risk pregnancy and has Elevated blood pressure affecting pregnancy, antepartum 01/12/23=133/71; Supervision of high risk pregnancy in second trimester; Late prenatal care at 50 4/7 (3 months); close interconceptual spacing (3 months); Smoker 1/2-3/4 ppd; History of drug use (meth, Fentanyl, oxycodone) 2022; and Positive urine drug screen 02/08/23 + MJ on their problem list.  Patient reports no complaints.  Contractions: Irregular. Vag. Bleeding: None.  Movement: Present. Denies leaking of fluid/ROM.   The following portions of the patient's history were reviewed and updated as appropriate: allergies, current medications, past family history, past medical history, past social history, past surgical history and problem list. Problem list updated.  Objective:   Vitals:   05/04/23 1358  BP: 118/62  Pulse: 100  Weight: 160 lb 12.8 oz (72.9 kg)    Fetal Status: Fetal Heart Rate (bpm): 131 Fundal Height: 31 cm Movement: Present     General:  Alert, oriented and cooperative. Patient is in no acute distress.  Skin: Skin is warm and dry. No rash noted.   Cardiovascular: Normal heart rate noted  Respiratory: Normal respiratory effort, no problems with respiration noted  Abdomen: Soft, gravid, appropriate for gestational age.  Pain/Pressure: Absent     Pelvic: Cervical exam deferred        Extremities: Normal range of motion.  Edema: None  Mental Status: Normal mood and affect. Normal behavior. Normal judgment and thought content.   Assessment and Plan:  Pregnancy: G3P1011 at [redacted]w[redacted]d  1. Supervision of high risk pregnancy in second trimester -taking PNV daily 30 lb 12.8 oz (14 kg) -exercise-nothing formal- walking and working  -reviewed 5/20 [place]- EFW  28%, posterior placenta, AFV wnl -has follow up US on 7/1 due to femur length 3rd percentile   2. Smoker 1/2-3/4 ppd -continues to smoke- but states in the last 3 days she only smoked 3 cigarettes per day -offered patches but patient declined -states shell be ready to quit after this baby is born  35. Positive urine drug screen 02/08/23 + MJ -denies smoking but has been using gummies  4. Insufficient antepartum care -missed multiple appointments- encouraged to keep appointments  -denies barriers to keeping appointments- offered help with transportation -pt states "it's just me"   Preterm labor symptoms and general obstetric precautions including but not limited to vaginal bleeding, contractions, leaking of fluid and fetal movement were reviewed in detail with the patient. Please refer to After Visit Summary for other counseling recommendations.  No follow-ups on file.  Future Appointments  Date Time Provider Department Center  05/18/2023  3:50 PM [facility] PROVIDER [facility] None  05/24/2023 11:00 AM [facility]    Lenice Llamas, [place]

## 2023-05-18 ENCOUNTER — Telehealth: Payer: Self-pay

## 2023-05-18 ENCOUNTER — Ambulatory Visit: Payer: Medicaid Other

## 2023-05-18 NOTE — Telephone Encounter (Signed)
[facility] as scheduled 05/18/23 for MHC RV. Call to client and per recorded message, voicemail is not set up (noted on pink sticky to request client set up). Call to work number and no answer. Call to mother (emergency contact) who states client had a tooth pulled today. Requested mother assist [place] in contacting client and request she call us to schedule MHC appt. Mother agrees to do so. (Call to mother initially picked up by a very young sounding child and this RN asked to speak to an adult. Female voice heard in background asking who it is. This RN then asked to speak to an adult and heard child say "here". Mother then came to phone). Jossie Ng, RN

## 2023-05-21 NOTE — Telephone Encounter (Signed)
Call to client and left message requesting she reschedule recently missed MHC RV appt. Number to call provided (Request to set up voicemail removed from pink sticky note). Jossie Ng, RN

## 2023-05-24 ENCOUNTER — Ambulatory Visit: Payer: Medicaid Other

## 2023-05-24 DIAGNOSIS — O169 Unspecified maternal hypertension, unspecified trimester: Secondary | ICD-10-CM

## 2023-05-24 DIAGNOSIS — F17200 Nicotine dependence, unspecified, uncomplicated: Secondary | ICD-10-CM

## 2023-05-24 DIAGNOSIS — O0930 Supervision of pregnancy with insufficient antenatal care, unspecified trimester: Secondary | ICD-10-CM

## 2023-05-24 DIAGNOSIS — F1991 Other psychoactive substance use, unspecified, in remission: Secondary | ICD-10-CM

## 2023-05-26 ENCOUNTER — Other Ambulatory Visit: Payer: Self-pay | Admitting: Obstetrics

## 2023-05-26 ENCOUNTER — Ambulatory Visit: Payer: Medicaid Other | Attending: Maternal & Fetal Medicine

## 2023-05-26 ENCOUNTER — Other Ambulatory Visit: Payer: Self-pay

## 2023-05-26 ENCOUNTER — Ambulatory Visit: Payer: Medicaid Other

## 2023-05-26 VITALS — BP 130/82 | HR 109 | Temp 98.0°F | Ht 68.0 in | Wt 167.5 lb

## 2023-05-26 DIAGNOSIS — O169 Unspecified maternal hypertension, unspecified trimester: Secondary | ICD-10-CM

## 2023-05-26 DIAGNOSIS — O09893 Supervision of other high risk pregnancies, third trimester: Secondary | ICD-10-CM | POA: Diagnosis not present

## 2023-05-26 DIAGNOSIS — O0930 Supervision of pregnancy with insufficient antenatal care, unspecified trimester: Secondary | ICD-10-CM

## 2023-05-26 DIAGNOSIS — Z3A34 34 weeks gestation of pregnancy: Secondary | ICD-10-CM | POA: Diagnosis not present

## 2023-05-26 DIAGNOSIS — F17210 Nicotine dependence, cigarettes, uncomplicated: Secondary | ICD-10-CM

## 2023-05-26 DIAGNOSIS — O99333 Smoking (tobacco) complicating pregnancy, third trimester: Secondary | ICD-10-CM | POA: Diagnosis not present

## 2023-05-26 DIAGNOSIS — F1991 Other psychoactive substance use, unspecified, in remission: Secondary | ICD-10-CM

## 2023-05-26 DIAGNOSIS — O0992 Supervision of high risk pregnancy, unspecified, second trimester: Secondary | ICD-10-CM

## 2023-05-26 DIAGNOSIS — O368330 Maternal care for abnormalities of the fetal heart rate or rhythm, third trimester, not applicable or unspecified: Secondary | ICD-10-CM | POA: Diagnosis not present

## 2023-05-26 DIAGNOSIS — R825 Elevated urine levels of drugs, medicaments and biological substances: Secondary | ICD-10-CM

## 2023-05-26 DIAGNOSIS — O0933 Supervision of pregnancy with insufficient antenatal care, third trimester: Secondary | ICD-10-CM | POA: Diagnosis not present

## 2023-05-26 DIAGNOSIS — Z3687 Encounter for antenatal screening for uncertain dates: Secondary | ICD-10-CM | POA: Diagnosis not present

## 2023-05-26 DIAGNOSIS — F17200 Nicotine dependence, unspecified, uncomplicated: Secondary | ICD-10-CM

## 2023-05-26 NOTE — Telephone Encounter (Signed)
TC to patient and LM with number to call. Patient needs MH RV appointment, she missed her 05/18/23 appointment. Patient also has [facility] [place] U/S appointment today, and message left informed patient of U/S appointment and time to arrive. Patient message also counseled to call for [facility] RV asap.Marland KitchenMarland KitchenBurt Knack, RN

## 2023-05-26 NOTE — Procedures (Signed)
Sabrina Edwards 25-Feb-1996 [redacted]w[redacted]d  Fetus A Non-Stress Test Interpretation for 05/26/23  Indication:  Smoking, LETC, Short Interval between Preg  Fetal Heart Rate A Mode: External Baseline Rate (A): 160 bpm Variability: Moderate Accelerations: 15 x 15 Multiple birth?: No  Uterine Activity Mode: Toco  Interpretation (Fetal Testing) Nonstress Test Interpretation: Reactive (Dr. Bryn Gulling interpreted NST) Follow-up NST scheduled in 1 week.

## 2023-05-28 NOTE — Telephone Encounter (Signed)
Called patient on work phone and scheduled next appointment for 07/11 at 1:20 pm. Patient aware of upcoming NST appointment at [facility] on 05/31/2023. BTHIELE RN

## 2023-05-31 ENCOUNTER — Ambulatory Visit: Payer: Medicaid Other | Admitting: Maternal & Fetal Medicine

## 2023-05-31 ENCOUNTER — Ambulatory Visit: Payer: Medicaid Other | Attending: Maternal & Fetal Medicine

## 2023-05-31 ENCOUNTER — Other Ambulatory Visit: Payer: Self-pay

## 2023-05-31 VITALS — BP 135/82 | HR 106 | Temp 97.8°F | Ht 68.0 in | Wt 169.5 lb

## 2023-05-31 DIAGNOSIS — F17210 Nicotine dependence, cigarettes, uncomplicated: Secondary | ICD-10-CM

## 2023-05-31 DIAGNOSIS — F1991 Other psychoactive substance use, unspecified, in remission: Secondary | ICD-10-CM

## 2023-05-31 DIAGNOSIS — Z3A35 35 weeks gestation of pregnancy: Secondary | ICD-10-CM | POA: Diagnosis not present

## 2023-05-31 DIAGNOSIS — O368330 Maternal care for abnormalities of the fetal heart rate or rhythm, third trimester, not applicable or unspecified: Secondary | ICD-10-CM | POA: Diagnosis not present

## 2023-05-31 DIAGNOSIS — O99333 Smoking (tobacco) complicating pregnancy, third trimester: Secondary | ICD-10-CM | POA: Diagnosis not present

## 2023-05-31 NOTE — Procedures (Signed)
Sabrina Edwards 08-18-1996 [redacted]w[redacted]d  Fetus A Non-Stress Test Interpretation for 05/31/23  Indication:  Fetal Tachycardia, Tobacco Use, LETC, Short Interval Between Pregnancies  Fetal Heart Rate A Mode: External Baseline Rate (A): 140 bpm Variability: Moderate Accelerations: 15 x 15 Decelerations: None Multiple birth?: No  Uterine Activity Mode: Toco  Interpretation (Fetal Testing) Nonstress Test Interpretation: Reactive (Dr. Grace Bushy Interpreted)

## 2023-06-03 ENCOUNTER — Other Ambulatory Visit: Payer: Self-pay | Admitting: Physician Assistant

## 2023-06-03 ENCOUNTER — Ambulatory Visit: Payer: Medicaid Other | Admitting: Physician Assistant

## 2023-06-03 ENCOUNTER — Encounter: Payer: Self-pay | Admitting: Physician Assistant

## 2023-06-03 VITALS — BP 122/73 | HR 110 | Temp 97.3°F | Wt 170.4 lb

## 2023-06-03 DIAGNOSIS — O169 Unspecified maternal hypertension, unspecified trimester: Secondary | ICD-10-CM

## 2023-06-03 DIAGNOSIS — O0993 Supervision of high risk pregnancy, unspecified, third trimester: Secondary | ICD-10-CM

## 2023-06-03 DIAGNOSIS — O0992 Supervision of high risk pregnancy, unspecified, second trimester: Secondary | ICD-10-CM

## 2023-06-03 DIAGNOSIS — O99330 Smoking (tobacco) complicating pregnancy, unspecified trimester: Secondary | ICD-10-CM

## 2023-06-03 DIAGNOSIS — Z3A36 36 weeks gestation of pregnancy: Secondary | ICD-10-CM

## 2023-06-03 MED ORDER — PRENATAL 27-0.8 MG PO TABS
1.0000 | ORAL_TABLET | Freq: Every day | ORAL | 2 refills | Status: DC
Start: 2023-06-03 — End: 2023-06-22

## 2023-06-03 NOTE — Progress Notes (Signed)
Kept 05/31/2023 [facility] NST and aware of [facility]FM [place] appt 06/23/2023.

## 2023-06-03 NOTE — Progress Notes (Signed)
1 week MHC RV appt scheduled for 06/10/2023 and appt reminder card given. Jossie Ng, RN

## 2023-06-03 NOTE — Progress Notes (Signed)
[facility]  PRENATAL VISIT NOTE  Subjective:  Sabrina Edwards is a 27 y.o. G3P1011 at [redacted]w[redacted]d being seen today for ongoing prenatal care.  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  Patient reports no complaints.  Contractions: Not present. Vag. Bleeding: None.  Movement: Present. Denies leaking of fluid/ROM.   The following portions of the patient's history were reviewed and updated as appropriate: allergies, current medications, past family history, past medical history, past social history, past surgical history and problem list. Problem list updated.  Objective:   Vitals:   06/03/23 1330  BP: 122/73  Pulse: (!) 110  Temp: (!) 97.3 F (36.3 C)  Weight: 170 lb 6.4 oz (77.3 kg)    Fetal Status: Fetal Heart Rate (bpm): 124 Fundal Height: 37 cm Movement: Present  Presentation: Vertex  General:  Alert, oriented and cooperative. Patient is in no acute distress.  Skin: Skin is warm and dry. No rash noted.   Cardiovascular: Normal heart rate noted  Respiratory: Normal respiratory effort, no problems with respiration noted  Abdomen: Soft, gravid, appropriate for gestational age.  Pain/Pressure: Absent     Pelvic: Cervical exam deferred        Extremities: Normal range of motion.  Edema: None  Mental Status: Normal mood and affect. Normal behavior. Normal judgment and thought content.   Assessment and Plan:  Pregnancy: G3P1011 at [redacted]w[redacted]d  1. Supervision of high risk pregnancy in second trimester Recommend routine GBS,  GC, chlamydia testing today. Pt declined. Pt counseled re: importance of testing, rationale for testing. Gave written info re: GBS testing. Pt wants to discuss with her mother and is considering doing testing at [facility].  2. [redacted] weeks gestation of pregnancy Continue weekly visits.  3. Elevated blood pressure affecting pregnancy, antepartum 01/12/23=133/71 BP reassuring today.  4. 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   Preterm labor symptoms and general obstetric precautions including but not limited to vaginal bleeding, contractions, leaking of fluid and fetal movement were reviewed in detail with the patient. Please refer to After Visit Summary for other counseling recommendations.  Return in about 1 week (around 06/10/2023) for Routine prenatal care.  Future Appointments  Date Time Provider Department Center  06/10/2023  1:20 PM [facility] PROVIDER [facility] None  06/23/2023 11:00 AM [facility]    Landry Dyke, PA-C

## 2023-06-10 ENCOUNTER — Ambulatory Visit: Payer: Medicaid Other

## 2023-06-15 ENCOUNTER — Ambulatory Visit: Payer: Medicaid Other | Admitting: Advanced Practice Midwife

## 2023-06-15 VITALS — BP 105/60 | HR 124 | Temp 97.6°F | Wt 172.3 lb

## 2023-06-15 DIAGNOSIS — Z91199 Patient's noncompliance with other medical treatment and regimen due to unspecified reason: Secondary | ICD-10-CM

## 2023-06-15 DIAGNOSIS — O0993 Supervision of high risk pregnancy, unspecified, third trimester: Secondary | ICD-10-CM

## 2023-06-15 DIAGNOSIS — O0992 Supervision of high risk pregnancy, unspecified, second trimester: Secondary | ICD-10-CM

## 2023-06-15 DIAGNOSIS — Z5321 Procedure and treatment not carried out due to patient leaving prior to being seen by health care provider: Secondary | ICD-10-CM

## 2023-06-15 DIAGNOSIS — O09899 Supervision of other high risk pregnancies, unspecified trimester: Secondary | ICD-10-CM

## 2023-06-15 NOTE — Progress Notes (Addendum)
Aware of [facility] [place] appt 06/23/23 at 1100. Declined 36 week cultures again today. Aware of 06/23/23  [place] appt at [facility] [place].  1 week [facility] appt scheduled with reminder card given.Jossie Ng, RN Client left prior to being seen by provider. Jossie Ng, RN

## 2023-06-15 NOTE — Progress Notes (Signed)
Pt left without being seen by provider.

## 2023-06-22 ENCOUNTER — Ambulatory Visit: Payer: Medicaid Other | Admitting: Advanced Practice Midwife

## 2023-06-22 ENCOUNTER — Ambulatory Visit: Payer: Medicaid Other

## 2023-06-22 ENCOUNTER — Other Ambulatory Visit: Payer: Self-pay

## 2023-06-22 DIAGNOSIS — R825 Elevated urine levels of drugs, medicaments and biological substances: Secondary | ICD-10-CM

## 2023-06-22 DIAGNOSIS — O0993 Supervision of high risk pregnancy, unspecified, third trimester: Secondary | ICD-10-CM

## 2023-06-22 DIAGNOSIS — Z91199 Patient's noncompliance with other medical treatment and regimen due to unspecified reason: Secondary | ICD-10-CM

## 2023-06-22 DIAGNOSIS — O99333 Smoking (tobacco) complicating pregnancy, third trimester: Secondary | ICD-10-CM

## 2023-06-22 DIAGNOSIS — O09899 Supervision of other high risk pregnancies, unspecified trimester: Secondary | ICD-10-CM

## 2023-06-22 DIAGNOSIS — O0930 Supervision of pregnancy with insufficient antenatal care, unspecified trimester: Secondary | ICD-10-CM

## 2023-06-22 DIAGNOSIS — O368390 Maternal care for abnormalities of the fetal heart rate or rhythm, unspecified trimester, not applicable or unspecified: Secondary | ICD-10-CM

## 2023-06-22 DIAGNOSIS — O99330 Smoking (tobacco) complicating pregnancy, unspecified trimester: Secondary | ICD-10-CM

## 2023-06-22 DIAGNOSIS — O0992 Supervision of high risk pregnancy, unspecified, second trimester: Secondary | ICD-10-CM

## 2023-06-22 DIAGNOSIS — F1991 Other psychoactive substance use, unspecified, in remission: Secondary | ICD-10-CM

## 2023-06-22 DIAGNOSIS — O0933 Supervision of pregnancy with insufficient antenatal care, third trimester: Secondary | ICD-10-CM

## 2023-06-22 LAB — URINALYSIS
Bilirubin, UA: NEGATIVE
Glucose, UA: NEGATIVE
Ketones, UA: NEGATIVE
Nitrite, UA: NEGATIVE
Protein,UA: NEGATIVE
RBC, UA: NEGATIVE
Specific Gravity, UA: 1.02 (ref 1.005–1.030)
Urobilinogen, Ur: 0.2 mg/dL (ref 0.2–1.0)
pH, UA: 7 (ref 5.0–7.5)

## 2023-06-22 MED ORDER — PRENATAL VITAMIN 27-0.8 MG PO TABS
1.0000 | ORAL_TABLET | Freq: Every day | ORAL | Status: AC
Start: 2023-06-22 — End: ?

## 2023-06-22 NOTE — Progress Notes (Signed)
Urine dip reviewed with provider. Patient given PNV per provider verbal order.Burt Knack, RN

## 2023-06-22 NOTE — Progress Notes (Signed)
Patient here for [facility] at 38 5/7. Declines 36 week labs. Aware of [facility] U/S on 06/23/23 at 11:00.Burt Knack, RN

## 2023-06-22 NOTE — Progress Notes (Signed)
[facility]  PRENATAL VISIT NOTE  Subjective:  Sabrina Edwards is a 27 y.o. G3P1011 at [redacted]w[redacted]d being seen today for ongoing prenatal care.  She is currently monitored for the following issues for this high-risk pregnancy and has Elevated blood pressure affecting pregnancy, antepartum 01/12/23=133/71; Supervision of high risk pregnancy in second trimester; Late prenatal care at 22 4/7 (3 months); close interconceptual spacing (3 months); Tobacco use disorder affecting pregnancy, 1/2-3/4 ppd; History of drug use (meth, Fentanyl, oxycodone) 2022; Positive urine drug screen 02/08/23 + MJ; and Noncompliant pregnant patient refusing 36 wk labs, pap on their problem list.  Patient reports no complaints.  Contractions: Not present. Vag. Bleeding: None.  Movement: Present. Denies leaking of fluid/ROM.   The following portions of the patient's history were reviewed and updated as appropriate: allergies, current medications, past family history, past medical history, past social history, past surgical history and problem list. Problem list updated.  Objective:  There were no vitals filed for this visit.  Fetal Status: Fetal Heart Rate (bpm): 153 Fundal Height: 37 cm Movement: Present  Presentation: Vertex  General:  Alert, oriented and cooperative. Patient is in no acute distress.  Skin: Skin is warm and dry. No rash noted.   Cardiovascular: Normal heart rate noted  Respiratory: Normal respiratory effort, no problems with respiration noted  Abdomen: Soft, gravid, appropriate for gestational age.  Pain/Pressure: Absent     Pelvic: Cervical exam deferred        Extremities: Normal range of motion.  Edema: None  Mental Status: Normal mood and affect. Normal behavior. Normal judgment and thought content.   Assessment and Plan:  Pregnancy: G3P1011 at [redacted]w[redacted]d  1. Supervision of high risk pregnancy in second trimester 42 lb 4.8 oz (19.2 kg) 3 lb wt loss in last week;  states has 2-3 BM's/day and denies illness or diarrhea Kept 05/31/23 u/s and BPP Has 06/23/23 u/s with NST Ran out of vits 2 wks ago--to give more today Has car seat and crib Knows when to go to L&D Baby has no name yet Working 36 hrs/wk at [employer] in [facility] [place] with her mom in [place]; states will have no one with her on L&D "I don't need anyone with me" Refused 36 wk labs on 06/03/23 and today  - Urinalysis (Urine Dip)  2. Tobacco use disorder affecting pregnancy, 1/2-3/4 ppd Smoking 2-3 cpd--counseled via 5 A's to stop   3. Positive urine drug screen 02/08/23 + MJ States last gummy 3 wks ago and denies MJ  - [id number]+Alc-Unbund  4. Late prenatal care at 22 4/7 (3 months)   5. Noncompliant pregnant patient, antepartum Refusing 36 wk labs x2; counseled pt extensively about potential implications of GBS on fetus/newborn and pt states "I'm not worried about this baby, it's fine"  6. close interconceptual spacing (3 months) Pt states will use abstinance for birth control pp   Term labor symptoms and general obstetric precautions including but not limited to vaginal bleeding, contractions, leaking of fluid and fetal movement were reviewed in detail with the patient. Please refer to After Visit Summary for other counseling recommendations.  Return in about 1 week (around 06/29/2023) for routine PNC.  Future Appointments  Date Time Provider Department Center  06/23/2023 11:00 AM [facility]  06/30/2023  1:40 PM [facility] PROVIDER [facility] None    Alberteen Spindle, CNM

## 2023-06-23 ENCOUNTER — Other Ambulatory Visit: Payer: Self-pay

## 2023-06-23 ENCOUNTER — Ambulatory Visit: Payer: Medicaid Other | Attending: Obstetrics and Gynecology

## 2023-06-23 DIAGNOSIS — F1991 Other psychoactive substance use, unspecified, in remission: Secondary | ICD-10-CM

## 2023-06-23 DIAGNOSIS — F17210 Nicotine dependence, cigarettes, uncomplicated: Secondary | ICD-10-CM

## 2023-06-23 DIAGNOSIS — O99333 Smoking (tobacco) complicating pregnancy, third trimester: Secondary | ICD-10-CM

## 2023-06-23 DIAGNOSIS — O0933 Supervision of pregnancy with insufficient antenatal care, third trimester: Secondary | ICD-10-CM | POA: Diagnosis not present

## 2023-06-23 DIAGNOSIS — O368390 Maternal care for abnormalities of the fetal heart rate or rhythm, unspecified trimester, not applicable or unspecified: Secondary | ICD-10-CM

## 2023-06-23 DIAGNOSIS — Z3A38 38 weeks gestation of pregnancy: Secondary | ICD-10-CM | POA: Diagnosis not present

## 2023-06-23 DIAGNOSIS — Z362 Encounter for other antenatal screening follow-up: Secondary | ICD-10-CM | POA: Diagnosis not present

## 2023-06-23 DIAGNOSIS — O0930 Supervision of pregnancy with insufficient antenatal care, unspecified trimester: Secondary | ICD-10-CM

## 2023-06-23 DIAGNOSIS — O09293 Supervision of pregnancy with other poor reproductive or obstetric history, third trimester: Secondary | ICD-10-CM

## 2023-06-23 DIAGNOSIS — O368330 Maternal care for abnormalities of the fetal heart rate or rhythm, third trimester, not applicable or unspecified: Secondary | ICD-10-CM | POA: Diagnosis present

## 2023-06-29 NOTE — Progress Notes (Signed)
[facility]  PRENATAL VISIT NOTE  Subjective:  Sabrina Edwards is a 27 y.o. G3P1011 at [redacted]w[redacted]d being seen today for ongoing prenatal care.  She is currently monitored for the following issues for this high-risk pregnancy and has Elevated blood pressure affecting pregnancy, antepartum 01/12/23=133/71; Supervision of high risk pregnancy in second trimester; Late prenatal care at 22 4/7 (3 months); close interconceptual spacing (3 months); Tobacco use disorder affecting pregnancy, 1/2-3/4 ppd; History of drug use (meth, Fentanyl, oxycodone) 2022; Positive urine drug screen 02/08/23 + MJ; +UDS MJ 06/22/23; and Noncompliant pregnant patient refusing 36 wk labs, pap on their problem list.  Patient reports {sx:[id number]}.   .  .   . Denies leaking of fluid/ROM.   The following portions of the patient's history were reviewed and updated as appropriate: allergies, current medications, past family history, past medical history, past social history, past surgical history and problem list. Problem list updated.  Objective:  There were no vitals filed for this visit.  Fetal Status:           General:  Alert, oriented and cooperative. Patient is in no acute distress.  Skin: Skin is warm and dry. No rash noted.   Cardiovascular: Normal heart rate noted  Respiratory: Normal respiratory effort, no problems with respiration noted  Abdomen: Soft, gravid, appropriate for gestational age.        Pelvic: {Blank single:19197::"Cervical exam performed","Cervical exam deferred"}        Extremities: Normal range of motion.     Mental Status: Normal mood and affect. Normal behavior. Normal judgment and thought content.   Assessment and Plan:  Pregnancy: G3P1011 at [redacted]w[redacted]d  1. Supervision of high risk pregnancy in second trimester -previously refused 36 week cultures   2. Tobacco use disorder affecting pregnancy, 1/2-3/4 ppd ***   Term labor symptoms and general obstetric  precautions including but not limited to vaginal bleeding, contractions, leaking of fluid and fetal movement were reviewed in detail with the patient. Please refer to After Visit Summary for other counseling recommendations.  No follow-ups on file.  Future Appointments  Date Time Provider Department Center  06/30/2023  1:40 PM [facility] PROVIDER [facility] None    Lenice Llamas, FNP

## 2023-06-30 ENCOUNTER — Encounter: Payer: Medicaid Other | Admitting: Family Medicine

## 2023-06-30 ENCOUNTER — Telehealth: Payer: Self-pay

## 2023-06-30 DIAGNOSIS — O99330 Smoking (tobacco) complicating pregnancy, unspecified trimester: Secondary | ICD-10-CM

## 2023-06-30 DIAGNOSIS — O0992 Supervision of high risk pregnancy, unspecified, second trimester: Secondary | ICD-10-CM

## 2023-06-30 NOTE — Telephone Encounter (Signed)
Missed appointment 08/07. Patient called at work; scheduled for visit tomorrow afternoon at 3:30 pm. Jennings Books RN

## 2023-07-01 ENCOUNTER — Ambulatory Visit: Payer: Medicaid Other

## 2023-07-01 ENCOUNTER — Telehealth: Payer: Self-pay
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
## 2023-07-02 ENCOUNTER — Inpatient Hospital Stay
Admission: EM | Admit: 2023-07-02 | Discharge: 2023-07-02 | DRG: 807 | Disposition: A | Discharge status: Home / Self Care | Payer: Medicaid Other | Attending: Certified Nurse Midwife | Admitting: Certified Nurse Midwife

## 2023-07-02 ENCOUNTER — Encounter: Payer: Self-pay | Admitting: Obstetrics and Gynecology

## 2023-07-02 ENCOUNTER — Other Ambulatory Visit: Payer: Self-pay

## 2023-07-02 ENCOUNTER — Inpatient Hospital Stay: Payer: Medicaid Other | Admitting: Anesthesiology

## 2023-07-02 ENCOUNTER — Inpatient Hospital Stay (HOSPITAL_COMMUNITY)
Admission: EM | Admit: 2023-07-02 | Discharge: 2023-07-04 | Disposition: A | Discharge status: Home / Self Care | Payer: Medicaid Other | Source: Home / Self Care | Attending: Certified Nurse Midwife | Admitting: Certified Nurse Midwife

## 2023-07-02 DIAGNOSIS — O139 Gestational [pregnancy-induced] hypertension without significant proteinuria, unspecified trimester: Secondary | ICD-10-CM | POA: Insufficient documentation

## 2023-07-02 DIAGNOSIS — O480 Post-term pregnancy: Secondary | ICD-10-CM | POA: Diagnosis present

## 2023-07-02 DIAGNOSIS — O0992 Supervision of high risk pregnancy, unspecified, second trimester: Principal | ICD-10-CM

## 2023-07-02 DIAGNOSIS — O0993 Supervision of high risk pregnancy, unspecified, third trimester: Secondary | ICD-10-CM | POA: Diagnosis not present

## 2023-07-02 DIAGNOSIS — Z3A40 40 weeks gestation of pregnancy: Secondary | ICD-10-CM

## 2023-07-02 DIAGNOSIS — Z87891 Personal history of nicotine dependence: Secondary | ICD-10-CM

## 2023-07-02 DIAGNOSIS — O134 Gestational [pregnancy-induced] hypertension without significant proteinuria, complicating childbirth: Principal | ICD-10-CM | POA: Diagnosis present

## 2023-07-02 DIAGNOSIS — O99324 Drug use complicating childbirth: Secondary | ICD-10-CM | POA: Diagnosis present

## 2023-07-02 DIAGNOSIS — O26893 Other specified pregnancy related conditions, third trimester: Secondary | ICD-10-CM | POA: Diagnosis present

## 2023-07-02 DIAGNOSIS — F1290 Cannabis use, unspecified, uncomplicated: Secondary | ICD-10-CM | POA: Diagnosis present

## 2023-07-02 DIAGNOSIS — R109 Unspecified abdominal pain: Secondary | ICD-10-CM | POA: Diagnosis not present

## 2023-07-02 DIAGNOSIS — F1210 Cannabis abuse, uncomplicated: Secondary | ICD-10-CM | POA: Diagnosis not present

## 2023-07-02 DIAGNOSIS — O99330 Smoking (tobacco) complicating pregnancy, unspecified trimester: Secondary | ICD-10-CM

## 2023-07-02 DIAGNOSIS — O0930 Supervision of pregnancy with insufficient antenatal care, unspecified trimester: Secondary | ICD-10-CM

## 2023-07-02 DIAGNOSIS — O99334 Smoking (tobacco) complicating childbirth: Secondary | ICD-10-CM | POA: Diagnosis not present

## 2023-07-02 DIAGNOSIS — R825 Elevated urine levels of drugs, medicaments and biological substances: Secondary | ICD-10-CM

## 2023-07-02 DIAGNOSIS — F17210 Nicotine dependence, cigarettes, uncomplicated: Secondary | ICD-10-CM | POA: Diagnosis not present

## 2023-07-02 HISTORY — DX: Gestational (pregnancy-induced) hypertension without significant proteinuria, unspecified trimester: O13.9

## 2023-07-02 LAB — COMPREHENSIVE METABOLIC PANEL
ALT: 11 U/L (ref 0–44)
AST: 16 U/L (ref 15–41)
Albumin: 2.9 g/dL — ABNORMAL LOW (ref 3.5–5.0)
Alkaline Phosphatase: 192 U/L — ABNORMAL HIGH (ref 38–126)
Anion gap: 9 (ref 5–15)
BUN: 8 mg/dL (ref 6–20)
CO2: 20 mmol/L — ABNORMAL LOW (ref 22–32)
Calcium: 8.8 mg/dL — ABNORMAL LOW (ref 8.9–10.3)
Chloride: 106 mmol/L (ref 98–111)
Creatinine, Ser: 0.66 mg/dL (ref 0.44–1.00)
GFR, Estimated: >60 mL/min (ref >60)
Glucose, Bld: 80 mg/dL (ref 70–99)
Potassium: 4 mmol/L (ref 3.5–5.1)
Sodium: 135 mmol/L (ref 135–145)
Total Bilirubin: 0.5 mg/dL (ref 0.3–1.2)
Total Protein: 6.5 g/dL (ref 6.5–8.1)

## 2023-07-02 LAB — URINE DRUG SCREEN, QUALITATIVE (ARMC ONLY)
Amphetamines, Ur Screen: NOT DETECTED
Barbiturates, Ur Screen: NOT DETECTED
Benzodiazepine, Ur Scrn: NOT DETECTED
Cannabinoid 50 Ng, Ur ~~LOC~~: POSITIVE — AB
Cocaine Metabolite,Ur ~~LOC~~: NOT DETECTED
MDMA (Ecstasy)Ur Screen: NOT DETECTED
Methadone Scn, Ur: NOT DETECTED
Opiate, Ur Screen: NOT DETECTED
Phencyclidine (PCP) Ur S: NOT DETECTED
Tricyclic, Ur Screen: NOT DETECTED

## 2023-07-02 LAB — CBC
HCT: 35.1 % — ABNORMAL LOW (ref 36.0–46.0)
Hemoglobin: 12.3 g/dL (ref 12.0–15.0)
MCH: 32.5 pg (ref 26.0–34.0)
MCHC: 35 g/dL (ref 30.0–36.0)
MCV: 92.9 fL (ref 80.0–100.0)
Platelets: 161 10*3/uL (ref 150–400)
RBC: 3.78 MIL/uL — ABNORMAL LOW (ref 3.87–5.11)
RDW: 12.6 % (ref 11.5–15.5)
WBC: 12.7 10*3/uL — ABNORMAL HIGH (ref 4.0–10.5)
nRBC: 0 % (ref 0.0–0.2)

## 2023-07-02 LAB — TYPE AND SCREEN
ABO/RH(D): A POS
Antibody Screen: NEGATIVE

## 2023-07-02 LAB — PROTEIN / CREATININE RATIO, URINE
Creatinine, Urine: 129 mg/dL
Protein Creatinine Ratio: 0.16 mg/mg{Cre} — ABNORMAL HIGH (ref 0.00–0.15)
Total Protein, Urine: 20 mg/dL

## 2023-07-02 LAB — GROUP B STREP BY PCR: Group B strep by PCR: NEGATIVE

## 2023-07-02 MED ORDER — SOD CITRATE-CITRIC ACID 500-334 MG/5ML PO SOLN: 30.0000 mL | ORAL | Status: DC | PRN

## 2023-07-02 MED ORDER — OXYTOCIN BOLUS FROM INFUSION
333.0000 mL | Freq: Once | INTRAVENOUS | Status: AC
  Administered 2023-07-03: 333 mL via INTRAVENOUS

## 2023-07-02 MED ORDER — OXYCODONE-ACETAMINOPHEN 5-325 MG PO TABS: 2.0000 | ORAL_TABLET | ORAL | Status: DC | PRN

## 2023-07-02 MED ORDER — MISOPROSTOL 200 MCG PO TABS
ORAL_TABLET | ORAL | Status: AC
  Filled 2023-07-02: qty 4

## 2023-07-02 MED ORDER — ONDANSETRON HCL 4 MG/2ML IJ SOLN: 4.0000 mg | Freq: Four times a day (QID) | INTRAMUSCULAR | Status: DC | PRN

## 2023-07-02 MED ORDER — LIDOCAINE HCL (PF) 1 % IJ SOLN
INTRAMUSCULAR | Status: AC
  Filled 2023-07-02: qty 30

## 2023-07-02 MED ORDER — LIDOCAINE HCL (PF) 1 % IJ SOLN
INTRAMUSCULAR | Status: DC | PRN
  Administered 2023-07-02: 3 mL via SUBCUTANEOUS

## 2023-07-02 MED ORDER — AMMONIA AROMATIC IN INHA
RESPIRATORY_TRACT | Status: AC
  Filled 2023-07-02: qty 10

## 2023-07-02 MED ORDER — FENTANYL-BUPIVACAINE-NACL 0.5-0.125-0.9 MG/250ML-% EP SOLN
12.0000 mL/h | EPIDURAL | Status: DC | PRN
  Administered 2023-07-02: 12 mL/h via EPIDURAL

## 2023-07-02 MED ORDER — LACTATED RINGERS IV SOLN: 500.0000 mL | INTRAVENOUS | Status: DC | PRN

## 2023-07-02 MED ORDER — LIDOCAINE HCL (PF) 1 % IJ SOLN
30.0000 mL | INTRAMUSCULAR | Status: DC | PRN
  Filled 2023-07-02: qty 30

## 2023-07-02 MED ORDER — LACTATED RINGERS IV SOLN: INTRAVENOUS | Status: DC

## 2023-07-02 MED ORDER — FENTANYL-BUPIVACAINE-NACL 0.5-0.125-0.9 MG/250ML-% EP SOLN
EPIDURAL | Status: AC
  Filled 2023-07-02: qty 250

## 2023-07-02 MED ORDER — OXYCODONE-ACETAMINOPHEN 5-325 MG PO TABS: 1.0000 | ORAL_TABLET | ORAL | Status: DC | PRN

## 2023-07-02 MED ORDER — FLEET ENEMA 7-19 GM/118ML RE ENEM: 1.0000 | ENEMA | RECTAL | Status: DC | PRN

## 2023-07-02 MED ORDER — ONDANSETRON HCL 4 MG/2ML IJ SOLN
4.0000 mg | Freq: Four times a day (QID) | INTRAMUSCULAR | Status: DC | PRN
  Filled 2023-07-02: qty 2

## 2023-07-02 MED ORDER — FENTANYL CITRATE (PF) 100 MCG/2ML IJ SOLN: 50.0000 ug | INTRAMUSCULAR | Status: DC | PRN

## 2023-07-02 MED ORDER — SODIUM CHLORIDE 0.9 % IV SOLN
INTRAVENOUS | Status: DC | PRN
  Administered 2023-07-02 (×2): 5 mL via EPIDURAL

## 2023-07-02 MED ORDER — OXYTOCIN 10 UNIT/ML IJ SOLN
INTRAMUSCULAR | Status: AC
  Filled 2023-07-02: qty 2

## 2023-07-02 MED ORDER — PHENYLEPHRINE 80 MCG/ML (10ML) SYRINGE FOR IV PUSH (FOR BLOOD PRESSURE SUPPORT): 80.0000 ug | PREFILLED_SYRINGE | INTRAVENOUS | Status: DC | PRN

## 2023-07-02 MED ORDER — LIDOCAINE-EPINEPHRINE (PF) 1.5 %-1:200000 IJ SOLN
INTRAMUSCULAR | Status: DC | PRN
  Administered 2023-07-02: 3 mL via EPIDURAL

## 2023-07-02 MED ORDER — LACTATED RINGERS IV SOLN: 500.0000 mL | Freq: Once | INTRAVENOUS | Status: DC

## 2023-07-02 MED ORDER — LIDOCAINE HCL (PF) 1 % IJ SOLN: 30.0000 mL | INTRAMUSCULAR | Status: DC | PRN

## 2023-07-02 MED ORDER — ACETAMINOPHEN 325 MG PO TABS: 650.0000 mg | ORAL_TABLET | ORAL | Status: DC | PRN

## 2023-07-02 MED ORDER — DIPHENHYDRAMINE HCL 50 MG/ML IJ SOLN: 12.5000 mg | INTRAMUSCULAR | Status: DC | PRN

## 2023-07-02 MED ORDER — OXYTOCIN-SODIUM CHLORIDE 30-0.9 UT/500ML-% IV SOLN
2.5000 [IU]/h | INTRAVENOUS | Status: DC
  Administered 2023-07-03: 2.5 [IU]/h via INTRAVENOUS
  Filled 2023-07-02: qty 500

## 2023-07-02 MED ORDER — EPHEDRINE 5 MG/ML INJ: 10.0000 mg | INTRAVENOUS | Status: DC | PRN

## 2023-07-02 MED ORDER — BUPIVACAINE HCL (PF) 0.25 % IJ SOLN
INTRAMUSCULAR | Status: DC | PRN
  Administered 2023-07-02 (×2): 5 mL via EPIDURAL

## 2023-07-02 MED ORDER — OXYTOCIN-SODIUM CHLORIDE 30-0.9 UT/500ML-% IV SOLN
2.5000 [IU]/h | INTRAVENOUS | Status: DC
  Filled 2023-07-02: qty 500

## 2023-07-02 MED ORDER — OXYTOCIN BOLUS FROM INFUSION: 333.0000 mL | Freq: Once | INTRAVENOUS | Status: DC

## 2023-07-02 NOTE — Anesthesia Procedure Notes (Signed)
Epidural Patient location during procedure: OB Start time: 07/02/2023 7:25 PM End time: 07/02/2023 7:28 PM  Staffing Anesthesiologist: Cressie Betzler, Cleda Mccreedy, MD Performed: anesthesiologist   Preanesthetic Checklist Completed: patient identified, IV checked, site marked, risks and benefits discussed, surgical consent, monitors and equipment checked, pre-op evaluation and timeout performed  Epidural Patient position: sitting Prep: ChloraPrep Patient monitoring: heart rate, continuous pulse ox and blood pressure Approach: midline Location: L3-L4 Injection technique: LOR saline  Needle:  Needle type: Tuohy  Needle gauge: 17 G Needle length: 9 cm and 9 Needle insertion depth: 5 cm Catheter type: closed end flexible Catheter size: 19 Gauge Catheter at skin depth: 10 cm Test dose: negative and 1.5% lidocaine with Epi 1:200 K  Assessment Sensory level: T10 Events: blood not aspirated, no cerebrospinal fluid, injection not painful, no injection resistance, no paresthesia and negative IV test  Additional Notes 1 attempt Pt. Evaluated and documentation done after procedure finished. Patient identified. Risks/Benefits/Options discussed with patient including but not limited to bleeding, infection, nerve damage, paralysis, failed block, incomplete pain control, headache, blood pressure changes, nausea, vomiting, reactions to medication both or allergic, itching and postpartum back pain. Confirmed with bedside nurse the patient's most recent platelet count. Confirmed with patient that they are not currently taking any anticoagulation, have any bleeding history or any family history of bleeding disorders. Patient expressed understanding and wished to proceed. All questions were answered. Sterile technique was used throughout the entire procedure. Please see nursing notes for vital signs. Test dose was given through epidural catheter and negative prior to continuing to dose epidural or start  infusion. Warning signs of high block given to the patient including shortness of breath, tingling/numbness in hands, complete motor block, or any concerning symptoms with instructions to call for help. Patient was given instructions on fall risk and not to get out of bed. All questions and concerns addressed with instructions to call with any issues or inadequate analgesia.    Patient tolerated the insertion well without immediate complications.Reason for block:procedure for pain

## 2023-07-02 NOTE — Progress Notes (Signed)
LABOR NOTE   SUBJECTIVE:   Sabrina Edwards is a 27 y.o.  G3P1011  at [redacted]w[redacted]d in active labor. Ready for AROM. Comfortable with epidural.   Analgesia: Epidural  OBJECTIVE:  BP (!) 133/90 (BP Location: Left Arm)   Pulse 95   Temp 98 F (36.7 C) (Oral)   Resp 16   Ht 5\' 8"  (1.727 m)   Wt 77.1 kg   LMP 08/05/2022 (Approximate) Comment: unsure of LMP.  Doesn't remember having in Oct.  Positive home pregnancy test in Nov.  Some light spotting in December at the beginning of the month  BMI 25.85 kg/m  No intake/output data recorded.  SVE:   Dilation: 7 Effacement (%): 100 Station: -2 Exam by:: Hartley Barefoot, CNM CONTRACTIONS: regular, every 3-4 minutes FHR: Fetal heart tracing reviewed. Baseline: 150 Variability: moderate Accelerations: present Decelerations:none Category I  Labs: Lab Results  Component Value Date   WBC 12.7 (H) 07/02/2023   HGB 12.3 07/02/2023   HCT 35.1 (L) 07/02/2023   MCV 92.9 07/02/2023   PLT 161 07/02/2023    ASSESSMENT: 1) Spontaneous labor, progressing normally     Coping: comfortable with epidural     Membranes: ruptured, meconium @2028      GBS negative       Principal Problem:   Labor and delivery indication for care or intervention Active Problems:   Gestational hypertension   PLAN: Amniorhexis Anticipate NSVD   Dominica Severin, CNM 07/02/2023 8:31 PM

## 2023-07-02 NOTE — H&P (Signed)
History and Physical   HPI  Sabrina Edwards is a 27 y.o. G3P1011 at [redacted]w[redacted]d Estimated Date of Delivery: 07/01/23 who is being admitted for labor management. She has received her prenatal care with the health department. Her pregnancy has been complicated by nicotine and marijuana use.    OB History  OB History  Gravida Para Term Preterm AB Living  3 1 1  0 1 1  SAB IAB Ectopic Multiple Live Births  0 1 0 0 1    # Outcome Date GA Lbr Len/2nd Weight Sex Type Anes PTL Lv  3 Current           2 Term 06/10/22 [redacted]w[redacted]d / 00:51 2940 g M Vag-Spont EPI  LIV     Name: Stiehl,BOY Bonni     Apgar1: 8  Apgar5: 9  1 IAB 2016            PROBLEM LIST  Pregnancy complications or risks: Patient Active Problem List   Diagnosis Date Noted   Labor and delivery, indication for care 07/02/2023   Noncompliant pregnant patient refusing 36 wk labs, pap 06/15/2023   Positive urine drug screen 02/08/23 + MJ; +UDS MJ 06/22/23 02/15/2023   Elevated blood pressure affecting pregnancy, antepartum 01/12/23=133/71 01/12/2023   Supervision of high risk pregnancy in second trimester 01/12/2023   Late prenatal care at 22 4/7 (3 months) 01/12/2023   close interconceptual spacing (3 months) 01/12/2023   Tobacco use disorder affecting pregnancy, 1/2-3/4 ppd 01/12/2023   History of drug use (meth, Fentanyl, oxycodone) 2022 01/12/2023    Prenatal labs and studies: ABO, Rh: A/Positive/-- (02/20 1110) Antibody: Negative (02/20 1110) Rubella: 1.84 (02/20 1110) RPR: Non Reactive (05/24 1000)  HBsAg: Negative (02/20 1110)  HIV: Non Reactive (02/20 1110)  GBS: ordered    Past Medical History:  Diagnosis Date   Headache    Patient denies medical problems      Past Surgical History:  Procedure Laterality Date   NO PAST SURGERIES     NO PAST SURGERIES       Medications    Current Discharge Medication List     CONTINUE these medications which have NOT CHANGED   Details  Prenatal Vit-Fe Fumarate-FA  (PRENATAL VITAMIN) 27-0.8 MG TABS Take 1 tablet by mouth daily.   Associated Diagnoses: Supervision of high risk pregnancy in second trimester         Allergies  Patient has no known allergies.  Review of Systems  Constitutional: negative Eyes: negative Ears, nose, mouth, throat, and face: negative Respiratory: negative Cardiovascular: negative Gastrointestinal: negative Genitourinary:negative Integument/breast: negative Hematologic/lymphatic: negative Musculoskeletal:negative Neurological: negative Behavioral/Psych: negative Endocrine: negative Allergic/Immunologic: negative  Physical Exam  BP (!) 140/85   Pulse 74   Temp 98.4 F (36.9 C) (Oral)   Resp 16   LMP 08/05/2022 (Approximate) Comment: unsure of LMP.  Doesn't remember having in Oct.  Positive home pregnancy test in Nov.  Some light spotting in December at the beginning of the month  Lungs:  CTA B Cardio: RRR without M/R/G Abd: Soft, gravid, NT Presentation: cephalic EXT: No C/C/ 1+ Edema DTRs: 2+ B CERVIX: Dilation: 5 Effacement (%): 80 Cervical Position: Middle Station: -2, -1 Presentation: Vertex Exam by:: RRL  See Prenatal records for more detailed PE.     FHR:  Baseline: 120 bpm, Variability: Good {> 6 bpm), Accelerations: Reactive, and Decelerations: Absent  Toco: Uterine Contractions: Frequency: Every 3-10 minutes and Intensity: mild to moderate  Test Results  No results  found for this or any previous visit (from the past 24 hour(s)). Other:   Assessment   G3P1011 at [redacted]w[redacted]d Estimated Date of Delivery: 07/01/23  The fetus is reassuring.   Patient Active Problem List   Diagnosis Date Noted   Labor and delivery, indication for care 07/02/2023   Noncompliant pregnant patient refusing 36 wk labs, pap 06/15/2023   Positive urine drug screen 02/08/23 + MJ; +UDS MJ 06/22/23 02/15/2023   Elevated blood pressure affecting pregnancy, antepartum 01/12/23=133/71 01/12/2023   Supervision of high  risk pregnancy in second trimester 01/12/2023   Late prenatal care at 22 4/7 (3 months) 01/12/2023   close interconceptual spacing (3 months) 01/12/2023   Tobacco use disorder affecting pregnancy, 1/2-3/4 ppd 01/12/2023   History of drug use (meth, Fentanyl, oxycodone) 2022 01/12/2023    Plan  1. Admit to L&D :   2. EFM:-- Category 1 3. Iv pain medication or Epidural if desired.   4. Admission labs  5. Dr. Valentino Saxon consulted on plan of care and in agreement.   Doreene Burke, CNM  07/02/2023 6:50 AM

## 2023-07-02 NOTE — Discharge Summary (Signed)
LABOR & DELIVERY OB TRIAGE NOTE  SUBJECTIVE  HPI Sabrina Edwards is a 27 y.o. G3P1011 at [redacted]w[redacted]d who presents to Labor & Delivery for contractions. Contractions have now stopped. She desires discharge home to await active labor. She declines AROM or pitocin augmentation due to advanced dilation. Agrees to collection of GBS swab.  OB History     Gravida  3   Para  1   Term  1   Preterm  0   AB  1   Living  1      SAB  0   IAB  1   Ectopic  0   Multiple  0   Live Births  1           Scheduled Meds:  ammonia       lidocaine (PF)       misoprostol       oxytocin       oxytocin 40 units in LR 1000 mL  333 mL Intravenous Once   Continuous Infusions:  lactated ringers     lactated ringers     oxytocin     PRN Meds:.acetaminophen, ammonia, fentaNYL (SUBLIMAZE) injection, lactated ringers, lidocaine (PF), lidocaine (PF), misoprostol, ondansetron, oxytocin, sodium citrate-citric acid  OBJECTIVE  BP 129/79 (BP Location: Left Arm)   Pulse 93   Temp 98.4 F (36.9 C) (Oral)   Resp 16   LMP 08/05/2022 (Approximate) Comment: unsure of LMP.  Doesn't remember having in Oct.  Positive home pregnancy test in Nov.  Some light spotting in December at the beginning of the month  General: A&Ox4, NAD Heart: regular rate Lungs: normal work of breathing Abdomen: gravid, nontender Cervical exam: Dilation: 5 Effacement (%): 80 Cervical Position: Middle Station: -2, -1 Presentation: Vertex Exam by:: RRL   NST I reviewed the NST and it was reactive.  Baseline: 125 Variability: moderate Accelerations: present Decelerations:none Toco: irregular, occasional, x2 Category I  ASSESSMENT Impression  1) Pregnancy at G3P1011, [redacted]w[redacted]d, Estimated Date of Delivery: 07/01/23 2) Reassuring maternal/fetal status  PLAN Discharge home with term labor precautions, maintain next visit at [facility] as scheduled. Dominica Severin, CNM 07/02/23  9:16 AM

## 2023-07-02 NOTE — H&P (Signed)
History and Physical   HPI  Sabrina Edwards is a 27 y.o. G3P1011 at [redacted]w[redacted]d Estimated Date of Delivery: 07/01/23 who is being admitted for labor management. Contractions began yesterday, she was seen in triage today and had no cervical change after several hours. After discussion of risks & benefits of augmentation vs discharge home she preferred discharge home. Contractions increased in discomfort in the afternoon and she returned to labor & delivery.  Endorses fetal movement, denies loss of fluid or vaginal bleeding. Pregnancy complicated by short interval pregnancy, marijuana use in pregnancy.   OB History  OB History  Gravida Para Term Preterm AB Living  3 1 1  0 1 1  SAB IAB Ectopic Multiple Live Births  0 1 0 0 1    # Outcome Date GA Lbr Len/2nd Weight Sex Type Anes PTL Lv  3 Current           2 Term 06/10/22 [redacted]w[redacted]d / 00:51 2940 g M Vag-Spont EPI  LIV     Name: Zeis,BOY Jahniya     Apgar1: 8  Apgar5: 9  1 IAB 2016            PROBLEM LIST  Pregnancy complications or risks: Patient Active Problem List   Diagnosis Date Noted   Labor and delivery, indication for care 07/02/2023   Labor and delivery indication for care or intervention 07/02/2023   Noncompliant pregnant patient refusing 36 wk labs, pap 06/15/2023   Positive urine drug screen 02/08/23 + MJ; +UDS MJ 06/22/23 02/15/2023   Elevated blood pressure affecting pregnancy, antepartum 01/12/23=133/71 01/12/2023   Supervision of high risk pregnancy in second trimester 01/12/2023   Late prenatal care at 22 4/7 (3 months) 01/12/2023   close interconceptual spacing (3 months) 01/12/2023   Tobacco use disorder affecting pregnancy, 1/2-3/4 ppd 01/12/2023   History of drug use (meth, Fentanyl, oxycodone) 2022 01/12/2023    Prenatal labs and studies: ABO, Rh: A/Positive/-- (02/20 1110) Antibody: Negative (02/20 1110) Rubella: 1.84 (02/20 1110) RPR: Non Reactive (05/24 1000)  HBsAg: Negative (02/20 1110)  HIV: Non Reactive  (02/20 1110)  UJW:JXBJYNWG/-- (08/09 9562)   Past Medical History:  Diagnosis Date   Headache    Patient denies medical problems      Past Surgical History:  Procedure Laterality Date   NO PAST SURGERIES     NO PAST SURGERIES       Medications    Current Discharge Medication List     CONTINUE these medications which have NOT CHANGED   Details  Prenatal Vit-Fe Fumarate-FA (PRENATAL VITAMIN) 27-0.8 MG TABS Take 1 tablet by mouth daily.   Associated Diagnoses: Supervision of high risk pregnancy in second trimester         Allergies  Patient has no known allergies.  Review of Systems  Pertinent items are noted in HPI.  Physical Exam  BP (!) 133/90 (BP Location: Left Arm)   Pulse 95   Temp 98 F (36.7 C) (Oral)   Resp 16   Ht 5\' 8"  (1.727 m)   Wt 77.1 kg   LMP 08/05/2022 (Approximate) Comment: unsure of LMP.  Doesn't remember having in Oct.  Positive home pregnancy test in Nov.  Some light spotting in December at the beginning of the month  BMI 25.85 kg/m   General: A&Ox4. NAD Lungs:  Normal work of breathing Cardio: regular rate Abd: Soft, gravid, NT Presentation: cephalic  CERVIX: Dilation: 6 Effacement (%): 80 Station: -1 Presentation: Vertex Exam by:: Christean Leaf RN  See Prenatal records for more detailed PE.     FHR:  Baseline: 150 Variability: moderate Accelerations: present Decelerations: absent Toco: regular, every 5-63m minutes Category I  Test Results  Results for orders placed or performed during the hospital encounter of 07/02/23 (from the past 24 hour(s))  Group B strep by PCR     Status: None   Collection Time: 07/02/23  9:21 AM   Specimen: Vaginal/Rectal; Genital  Result Value Ref Range   Group B strep by PCR NEGATIVE NEGATIVE   Group B Strep negative  Assessment   G3P1011 at [redacted]w[redacted]d Estimated Date of Delivery: 07/01/23  Reassuring maternal/fetal status.  EFW on last ultrasound 7/31: 3353 gm 7 lb 6 oz 45 %   Patient  Active Problem List   Diagnosis Date Noted   Labor and delivery, indication for care 07/02/2023   Labor and delivery indication for care or intervention 07/02/2023   Noncompliant pregnant patient refusing 36 wk labs, pap 06/15/2023   Positive urine drug screen 02/08/23 + MJ; +UDS MJ 06/22/23 02/15/2023   Elevated blood pressure affecting pregnancy, antepartum 01/12/23=133/71 01/12/2023   Supervision of high risk pregnancy in second trimester 01/12/2023   Late prenatal care at 22 4/7 (3 months) 01/12/2023   close interconceptual spacing (3 months) 01/12/2023   Tobacco use disorder affecting pregnancy, 1/2-3/4 ppd 01/12/2023   History of drug use (meth, Fentanyl, oxycodone) 2022 01/12/2023    Plan  1. Admit to L&D: Expectant Management 2. EFM: CEFM-- Category 1 3. Pharmacologic pain relief if desired.   4. Admission labs, UDS for MJ use in pregnancy, CMP & UP/C for elevated BP on admission. 5. Anticipate NSVD 6. MD notified of admission  Dominica Severin, [facility] 07/02/2023 6:26 PM

## 2023-07-02 NOTE — OB Triage Note (Signed)
Pt had a discussion w Hartley Barefoot CNM and pt decided to return home. She reports not feeling frequent ctx's and endorses fetal movement. Pts vitals are stable. Discharge instructions reviewed and pt verbalized understanding. Pt taught to return to the hospital when her ctx's become closer together and more painful or for LOF, reduced fetal movement, or vaginal bleeding. Pt ambulatory and stable. Pt discharged by Lucretia Kern MD.

## 2023-07-02 NOTE — Anesthesia Preprocedure Evaluation (Signed)
Anesthesia Evaluation  Patient identified by MRN, date of birth, ID band Patient awake    Reviewed: Allergy & Precautions, NPO status , Patient's Chart, lab work & pertinent test results  History of Anesthesia Complications Negative for: history of anesthetic complications  Airway Mallampati: III  TM Distance: >3 FB Neck ROM: full    Dental  (+) Chipped   Pulmonary neg pulmonary ROS, Current Smoker   Pulmonary exam normal        Cardiovascular Exercise Tolerance: Good (-) hypertensionnegative cardio ROS Normal cardiovascular exam     Neuro/Psych  Headaches    GI/Hepatic negative GI ROS,,,  Endo/Other    Renal/GU   negative genitourinary   Musculoskeletal   Abdominal   Peds  Hematology negative hematology ROS (+)   Anesthesia Other Findings Past Medical History: No date: Headache No date: Patient denies medical problems  Past Surgical History: No date: NO PAST SURGERIES No date: NO PAST SURGERIES  BMI    Body Mass Index: 25.85 kg/m      Reproductive/Obstetrics (+) Pregnancy                             Anesthesia Physical Anesthesia Plan  ASA: 2  Anesthesia Plan: Epidural   Post-op Pain Management:    Induction:   PONV Risk Score and Plan:   Airway Management Planned: Natural Airway  Additional Equipment:   Intra-op Plan:   Post-operative Plan:   Informed Consent: I have reviewed the patients History and Physical, chart, labs and discussed the procedure including the risks, benefits and alternatives for the proposed anesthesia with the patient or authorized representative who has indicated his/her understanding and acceptance.     Dental Advisory Given  Plan Discussed with: Anesthesiologist  Anesthesia Plan Comments: (Patient reports no bleeding problems and no anticoagulant use.   Patient consented for risks of anesthesia including but not limited to:  -  adverse reactions to medications - risk of bleeding, infection and or nerve damage from epidural that could lead to paralysis - risk of headache or failed epidural - nerve damage due to positioning - that if epidural is used for C-section that there is a chance of epidural failure requiring spinal placement or conversion to GA - Damage to heart, brain, lungs, other parts of body or loss of life  Patient voiced understanding.)       Anesthesia Quick Evaluation

## 2023-07-03 ENCOUNTER — Encounter: Payer: Self-pay | Admitting: Obstetrics and Gynecology

## 2023-07-03 DIAGNOSIS — F17210 Nicotine dependence, cigarettes, uncomplicated: Secondary | ICD-10-CM | POA: Diagnosis not present

## 2023-07-03 DIAGNOSIS — Z3A40 40 weeks gestation of pregnancy: Secondary | ICD-10-CM

## 2023-07-03 DIAGNOSIS — O480 Post-term pregnancy: Secondary | ICD-10-CM | POA: Diagnosis not present

## 2023-07-03 DIAGNOSIS — O99334 Smoking (tobacco) complicating childbirth: Secondary | ICD-10-CM

## 2023-07-03 DIAGNOSIS — O134 Gestational [pregnancy-induced] hypertension without significant proteinuria, complicating childbirth: Principal | ICD-10-CM

## 2023-07-03 DIAGNOSIS — O99324 Drug use complicating childbirth: Secondary | ICD-10-CM | POA: Diagnosis not present

## 2023-07-03 DIAGNOSIS — F1210 Cannabis abuse, uncomplicated: Secondary | ICD-10-CM

## 2023-07-03 DIAGNOSIS — O0993 Supervision of high risk pregnancy, unspecified, third trimester: Secondary | ICD-10-CM | POA: Diagnosis not present

## 2023-07-03 LAB — CBC
HCT: 33.3 % — ABNORMAL LOW (ref 36.0–46.0)
Hemoglobin: 11.3 g/dL — ABNORMAL LOW (ref 12.0–15.0)
MCH: 31.7 pg (ref 26.0–34.0)
MCHC: 33.9 g/dL (ref 30.0–36.0)
MCV: 93.5 fL (ref 80.0–100.0)
Platelets: 147 10*3/uL — ABNORMAL LOW (ref 150–400)
RBC: 3.56 MIL/uL — ABNORMAL LOW (ref 3.87–5.11)
RDW: 12.4 % (ref 11.5–15.5)
WBC: 17.6 10*3/uL — ABNORMAL HIGH (ref 4.0–10.5)
nRBC: 0 % (ref 0.0–0.2)

## 2023-07-03 MED ORDER — ONDANSETRON HCL 4 MG PO TABS: 4.0000 mg | ORAL_TABLET | ORAL | Status: DC | PRN

## 2023-07-03 MED ORDER — WITCH HAZEL-GLYCERIN EX PADS: 1.0000 | MEDICATED_PAD | CUTANEOUS | Status: DC | PRN

## 2023-07-03 MED ORDER — SENNOSIDES-DOCUSATE SODIUM 8.6-50 MG PO TABS: 2.0000 | ORAL_TABLET | Freq: Every day | ORAL | Status: DC

## 2023-07-03 MED ORDER — PRENATAL MULTIVITAMIN CH
1.0000 | ORAL_TABLET | Freq: Every day | ORAL | Status: DC
  Administered 2023-07-03 – 2023-07-04 (×2): 1 via ORAL
  Filled 2023-07-03 (×2): qty 1

## 2023-07-03 MED ORDER — DIPHENHYDRAMINE HCL 25 MG PO CAPS: 25.0000 mg | ORAL_CAPSULE | Freq: Four times a day (QID) | ORAL | Status: DC | PRN

## 2023-07-03 MED ORDER — TETANUS-DIPHTH-ACELL PERTUSSIS 5-2.5-18.5 LF-MCG/0.5 IM SUSY: 0.5000 mL | PREFILLED_SYRINGE | Freq: Once | INTRAMUSCULAR | Status: DC

## 2023-07-03 MED ORDER — IBUPROFEN 600 MG PO TABS
600.0000 mg | ORAL_TABLET | Freq: Four times a day (QID) | ORAL | Status: DC
  Administered 2023-07-03 – 2023-07-04 (×5): 600 mg via ORAL
  Filled 2023-07-03 (×5): qty 1

## 2023-07-03 MED ORDER — DIBUCAINE (PERIANAL) 1 % EX OINT: 1.0000 | TOPICAL_OINTMENT | CUTANEOUS | Status: DC | PRN

## 2023-07-03 MED ORDER — ZOLPIDEM TARTRATE 5 MG PO TABS: 5.0000 mg | ORAL_TABLET | Freq: Every evening | ORAL | Status: DC | PRN

## 2023-07-03 MED ORDER — BENZOCAINE-MENTHOL 20-0.5 % EX AERO: 1.0000 | INHALATION_SPRAY | CUTANEOUS | Status: DC | PRN

## 2023-07-03 MED ORDER — ONDANSETRON HCL 4 MG/2ML IJ SOLN: 4.0000 mg | INTRAMUSCULAR | Status: DC | PRN

## 2023-07-03 MED ORDER — SIMETHICONE 80 MG PO CHEW: 80.0000 mg | CHEWABLE_TABLET | ORAL | Status: DC | PRN

## 2023-07-03 MED ORDER — COCONUT OIL OIL: 1.0000 | TOPICAL_OIL | Status: DC | PRN

## 2023-07-03 MED ORDER — ACETAMINOPHEN 325 MG PO TABS
650.0000 mg | ORAL_TABLET | ORAL | Status: DC | PRN
  Filled 2023-07-03: qty 2

## 2023-07-03 NOTE — Discharge Summary (Signed)
Postpartum Discharge Summary  Date of Service updated***     Patient Name: Sabrina Edwards DOB: [birth year] MRN: [id number]  Date of admission: 07/02/2023 Delivery date:07/02/2023 Delivering provider: Dominica Severin Date of discharge: 07/03/2023  Admitting diagnosis: Labor and delivery indication for care or intervention [O75.9] Intrauterine pregnancy: [redacted]w[redacted]d     Secondary diagnosis:  Principal Problem:   Labor and delivery indication for care or intervention Active Problems:   Gestational hypertension  Additional problems: ***    Discharge diagnosis: Term Pregnancy Delivered                                              Post partum procedures:{Postpartum procedures:23558} Augmentation: AROM Complications: None  Hospital course: Onset of Labor With Vaginal Delivery      27 y.o. yo G3P1011 at [redacted]w[redacted]d was admitted in Active Labor on 07/02/2023. Labor course was complicated by meconium stained fluid  Membrane Rupture Time/Date: 8:28 PM,07/02/2023  Delivery Method:Vaginal, Spontaneous Operative Delivery:N/A Episiotomy: None Lacerations:  1st degree Patient had a postpartum course complicated by ***.  She is ambulating, tolerating a regular diet, passing flatus, and urinating well. Patient is discharged home in stable condition on 07/03/23.  Newborn Data: Birth date:07/02/2023 Birth time:12:05 AM Gender:Female Living status:Living Apgars:8 ,9  Weight:   Magnesium Sulfate received: No BMZ received: No Rhophylac:N/A MMR:N/A T-DaP:{Tdap:23962} Flu: N/A Transfusion:{Transfusion received:30440034}  Physical exam  Vitals:   07/02/23 2034 07/02/23 2038 07/02/23 2039 07/02/23 2140  BP:  120/66  (!) 152/73  Pulse:  76  (!) 117  Resp:      Temp:      TempSrc:      SpO2: 100%  99%   Weight:      Height:       General: {Exam; general:21111117} Lochia: {Desc; appropriate/inappropriate:30686::"appropriate"} Uterine Fundus: {Desc; firm/soft:30687} Incision: {Exam;  incision:21111123} DVT Evaluation: {Exam; dvt:2111122} Labs: Lab Results  Component Value Date   WBC 12.7 (H) 07/02/2023   HGB 12.3 07/02/2023   HCT 35.1 (L) 07/02/2023   MCV 92.9 07/02/2023   PLT 161 07/02/2023      Latest Ref Rng & Units 07/02/2023    6:39 PM  CMP  Glucose 70 - 99 mg/dL 80   BUN 6 - 20 mg/dL 8   Creatinine 4.09 - 8.11 mg/dL 9.14   Sodium 782 - 956 mmol/L 135   Potassium 3.5 - 5.1 mmol/L 4.0   Chloride 98 - 111 mmol/L 106   CO2 22 - 32 mmol/L 20   Calcium 8.9 - 10.3 mg/dL 8.8   Total Protein 6.5 - 8.1 g/dL 6.5   Total Bilirubin 0.3 - 1.2 mg/dL 0.5   Alkaline Phos 38 - 126 U/L 192   AST 15 - 41 U/L 16   ALT 0 - 44 U/L 11    Edinburgh Score:    06/10/2022   12:43 PM  Edinburgh Postnatal Depression Scale Screening Tool  I have been able to laugh and see the funny side of things. 0  I have looked forward with enjoyment to things. 1  I have blamed myself unnecessarily when things went wrong. 2  I have been anxious or worried for no good reason. 2  I have felt scared or panicky for no good reason. 1  Things have been getting on top of me. 2  I have been so unhappy that I have  had difficulty sleeping. 0  I have felt sad or miserable. 1  I have been so unhappy that I have been crying. 1  The thought of harming myself has occurred to me. 0  Edinburgh Postnatal Depression Scale Total 10      After visit meds:  Allergies as of 07/03/2023   No Known Allergies   Med Rec must be completed prior to using this [facility]***        Discharge home in stable condition Infant Feeding: Bottle Infant Disposition:{CHL IP OB HOME WITH ZOXWRU:04540} Discharge instruction: per After Visit Summary and Postpartum booklet. Activity: Advance as tolerated. Pelvic rest for 6 weeks.  Diet: routine diet Anticipated Birth Control: OCPs Postpartum Appointment:{Outpatient follow up:23559} Additional Postpartum F/U: {PP Procedure:23957} Future Appointments: Future  Appointments  Date Time Provider Department Center  07/05/2023  8:40 AM [facility] PROVIDER [facility] None   Follow up Visit:      07/03/2023 Dominica Severin, CNM

## 2023-07-03 NOTE — Progress Notes (Signed)
Subjective:   Sabrina Edwards had a NSVB on 07/03/23. Her labor was uncomplicated. Has had routine postpartum care.  Patient is eating, hydrating, and voiding regularly without difficulty. Has yet to have BM. She is bottle feeding. Reports mild vaginal bleeding, denies passing large blood clots. Has had cramping abdomen pain relieved with tylenol/ibuprofen. Plans to use COCs for contraception. Denies anxiety/depression symptoms. Endorses good support from partner and family.   Objective:  Vital signs in last 24 hours: Temp:  [97.7 F (36.5 C)-98.6 F (37 C)] 98.6 F (37 C) (08/10 0848) Pulse Rate:  [54-117] 80 (08/10 0848) Resp:  [16-20] 18 (08/10 0848) BP: (120-152)/(66-130) 121/74 (08/10 0848) SpO2:  [97 %-100 %] 99 % (08/10 0848) Weight:  [77.1 kg] 77.1 kg (08/09 1757)    General: NAD Pulmonary: no increased work of breathing Breasts: soft, non-tender, nipples without breakdown Abdomen: soft, non-tender Fundus: firm, midline, U-2 Lochia: light rubra, no clots Perineum: no erythema or foul odor discharge, minimal edema, laceration well approximated Extremities: no edema, no erythema, no tenderness  Results for orders placed or performed during the hospital encounter of 07/02/23 (from the past 72 hour(s))  CBC     Status: Abnormal   Collection Time: 07/02/23  6:18 PM  Result Value Ref Range   WBC 12.7 (H) 4.0 - 10.5 K/uL   RBC 3.78 (L) 3.87 - 5.11 MIL/uL   Hemoglobin 12.3 12.0 - 15.0 g/dL   HCT 16.1 (L) 09.6 - 04.5 %   MCV 92.9 80.0 - 100.0 fL   MCH 32.5 26.0 - 34.0 pg   MCHC 35.0 30.0 - 36.0 g/dL   RDW 40.9 81.1 - 91.4 %   Platelets 161 150 - 400 K/uL   nRBC 0.0 0.0 - 0.2 %    Comment: Performed at [facility], [address] [place], [place] [address]  Type and screen [facility]     Status: None   Collection Time: 07/02/23  6:18 PM  Result Value Ref Range   ABO/RH(D) A POS    Antibody Screen NEG    Sample Expiration       07/05/2023,2359 Performed at [facility], [address] [place], [place] [address]   RPR     Status: None   Collection Time: 07/02/23  6:18 PM  Result Value Ref Range   RPR Ser Ql NON REACTIVE NON REACTIVE    Comment: Performed at [facility], [address], [place], [place] [address]  Urine Drug Screen, Qualitative ([facility] only)     Status: Abnormal   Collection Time: 07/02/23  6:18 PM  Result Value Ref Range   Tricyclic, Ur Screen NONE DETECTED NONE DETECTED   Amphetamines, Ur Screen NONE DETECTED NONE DETECTED   MDMA (Ecstasy)Ur Screen NONE DETECTED NONE DETECTED   Cocaine Metabolite,Ur [place] NONE DETECTED NONE DETECTED   Opiate, Ur Screen NONE DETECTED NONE DETECTED   Phencyclidine (PCP) Ur S NONE DETECTED NONE DETECTED   Cannabinoid 50 Ng, Ur [place] POSITIVE (A) NONE DETECTED   Barbiturates, Ur Screen NONE DETECTED NONE DETECTED   Benzodiazepine, Ur Scrn NONE DETECTED NONE DETECTED   Methadone Scn, Ur NONE DETECTED NONE DETECTED    Comment: (NOTE) Tricyclics + metabolites, urine    Cutoff 1000 ng/mL Amphetamines + metabolites, urine  Cutoff 1000 ng/mL MDMA (Ecstasy), urine              Cutoff 500 ng/mL Cocaine Metabolite, urine  Cutoff 300 ng/mL Opiate + metabolites, urine        Cutoff 300 ng/mL Phencyclidine (PCP), urine         Cutoff 25 ng/mL Cannabinoid, urine                 Cutoff 50 ng/mL Barbiturates + metabolites, urine  Cutoff 200 ng/mL Benzodiazepine, urine              Cutoff 200 ng/mL Methadone, urine                   Cutoff 300 ng/mL  The urine drug screen provides only a preliminary, unconfirmed analytical test result and should not be used for non-medical purposes. Clinical consideration and professional judgment should be applied to any positive drug screen result due to possible interfering substances. A more specific alternate chemical method must be used in order to obtain a confirmed analytical result. Gas chromatography / mass  spectrometry (GC/MS) is the preferred confirm atory method. Performed at [facility], [address] [place], [place] [address]   Protein / creatinine ratio, urine     Status: Abnormal   Collection Time: 07/02/23  6:21 PM  Result Value Ref Range   Creatinine, Urine 129 mg/dL   Total Protein, Urine 20 mg/dL    Comment: NO NORMAL RANGE ESTABLISHED FOR THIS TEST   Protein Creatinine Ratio 0.16 (H) 0.00 - 0.15 mg/mg[Cre]    Comment: Performed at [facility], [address] [place], [place] [address]  Comprehensive metabolic panel     Status: Abnormal   Collection Time: 07/02/23  6:39 PM  Result Value Ref Range   Sodium 135 135 - 145 mmol/L   Potassium 4.0 3.5 - 5.1 mmol/L   Chloride 106 98 - 111 mmol/L   CO2 20 (L) 22 - 32 mmol/L   Glucose, Bld 80 70 - 99 mg/dL    Comment: Glucose reference range applies only to samples taken after fasting for at least 8 hours.   BUN 8 6 - 20 mg/dL   Creatinine, Ser 1.02 0.44 - 1.00 mg/dL   Calcium 8.8 (L) 8.9 - 10.3 mg/dL   Total Protein 6.5 6.5 - 8.1 g/dL   Albumin 2.9 (L) 3.5 - 5.0 g/dL   AST 16 15 - 41 U/L   ALT 11 0 - 44 U/L   Alkaline Phosphatase 192 (H) 38 - 126 U/L   Total Bilirubin 0.5 0.3 - 1.2 mg/dL   GFR, Estimated >72 >53 mL/min    Comment: (NOTE) Calculated using the CKD-EPI Creatinine Equation (2021)    Anion gap 9 5 - 15    Comment: Performed at [facility], [address] [place], [place] [address]  CBC     Status: Abnormal   Collection Time: 07/03/23  5:50 AM  Result Value Ref Range   WBC 17.6 (H) 4.0 - 10.5 K/uL    Comment: WHITE COUNT CONFIRMED ON SMEAR   RBC 3.56 (L) 3.87 - 5.11 MIL/uL   Hemoglobin 11.3 (L) 12.0 - 15.0 g/dL   HCT 34.7 (L) 42.5 - 95.6 %   MCV 93.5 80.0 - 100.0 fL   MCH 31.7 26.0 - 34.0 pg   MCHC 33.9 30.0 - 36.0 g/dL   RDW 38.7 56.4 - 33.2 %   Platelets 147 (L) 150 - 400 K/uL   nRBC 0.0 0.0 - 0.2 %    Comment: Performed at [facility], [address], [place], [place] [address]  Assessment:   27 y.o. [id number] 0 day(s)  s/p NSVB Formula feeding Hemodynamically stable and symptomatic VSS Pain well controlled  Plan:     Blood Type --/--/A POS (08/09 1818) / Rubella 1.84 (02/20 1110) / Varicella Unknown Rhogam not indicated Feeding plan bottle, lactation support Education given regarding options for contraception.  Patient plans on oral contraceptives (estrogen/progesterone) for contraception. Continued routine postpartum care  Counseled on normal uterine involution and vaginal bleeding postpartum Anticipate discharge home tomorrow    Burney Gauze, CNM  OB/GYN 07/03/2023, 9:57 AM

## 2023-07-03 NOTE — TOC Initial Note (Signed)
CSW received a consult for Positive drug test  CSW spoke with RN Darel Hong prior to meeting with MOB. Per RN,patient tested positive for [facility] & baby did too.  CSW met with MOB at bedside. Explained HIPPA and MOB elected for guest to remain at bedside during assessment. Explained CSW's role and reason for referral.  MOB reported she is feeling "good" post delivery. MOB was alert/appropriate/attentive to Baby Confirmed contact information for MOB. MOB and Baby will be living with mother and grandma at discharge.   [facility] Drug Screen/CPS Report Policy. CPS Report made to [facility] CPS Worker  following assessment with MOB, as required by policy. Informed Sarah of plan for MOB and Baby to discharge 07/04/2023 and asked to be informed of any barriers to discharge prior to then.   MOB reported she receives WIC/Food Veterinary surgeon and will inform her Workers of Baby's birth. MOB plans to use [facility] for [facility]. MOB reported she has a crib/b[place]inett/pack and play, car seat, clothing, diapers, and all other items needed for Baby. MOB reported she has reliable transportation for herself and Baby. MOB denied resource needs at this time.   MOB reported she does not have a mental health history. MOB reported no previous mental health treatments- past/current. MOB reported she has a good support system and is coping well emotionally at this time. MOB denied SI, HI, or DV. MOB denied the need for mental health support resources at this time, reported she is aware of resources if needed.  CSW provided education and information sheets on PPD and SIDS. MOB verbalized understanding. CSW ecouraged MOB to reach out to her Provider with any questions or needs for support or resources, even after discharge.   MOB denied any needs or questions at this time. CSW encouraged MOB to reach out if any arise prior to discharge.   Please re consult CSW if any additional needs or concerns arise.

## 2023-07-04 DIAGNOSIS — O480 Post-term pregnancy: Secondary | ICD-10-CM | POA: Diagnosis not present

## 2023-07-04 DIAGNOSIS — O99324 Drug use complicating childbirth: Secondary | ICD-10-CM | POA: Diagnosis not present

## 2023-07-04 DIAGNOSIS — F17210 Nicotine dependence, cigarettes, uncomplicated: Secondary | ICD-10-CM | POA: Diagnosis not present

## 2023-07-04 DIAGNOSIS — O99334 Smoking (tobacco) complicating childbirth: Secondary | ICD-10-CM | POA: Diagnosis not present

## 2023-07-04 MED ORDER — IBUPROFEN 600 MG PO TABS: 600.0000 mg | ORAL_TABLET | Freq: Four times a day (QID) | ORAL | Status: AC

## 2023-07-04 MED ORDER — WITCH HAZEL-GLYCERIN EX PADS: 1.0000 | MEDICATED_PAD | CUTANEOUS | 12 refills | Status: AC | PRN

## 2023-07-04 MED ORDER — ACETAMINOPHEN 325 MG PO TABS: 650.0000 mg | ORAL_TABLET | ORAL | Status: AC | PRN

## 2023-07-04 MED ORDER — BENZOCAINE-MENTHOL 20-0.5 % EX AERO: 1.0000 | INHALATION_SPRAY | CUTANEOUS | Status: AC | PRN

## 2023-07-04 MED ORDER — DIBUCAINE (PERIANAL) 1 % EX OINT: 1.0000 | TOPICAL_OINTMENT | CUTANEOUS | Status: AC | PRN

## 2023-07-04 MED ORDER — SENNOSIDES-DOCUSATE SODIUM 8.6-50 MG PO TABS: 2.0000 | ORAL_TABLET | Freq: Every evening | ORAL | Status: AC | PRN

## 2023-07-04 NOTE — Anesthesia Postprocedure Evaluation (Signed)
Anesthesia Post Note  Patient: Database administrator  Procedure(s) Performed: AN AD HOC LABOR EPIDURAL  Patient location during evaluation: Mother Baby Anesthesia Type: Epidural Level of consciousness: awake and alert Pain management: pain level controlled Vital Signs Assessment: post-procedure vital signs reviewed and stable Respiratory status: spontaneous breathing, nonlabored ventilation and respiratory function stable Cardiovascular status: stable Postop Assessment: no headache, no backache and epidural receding Anesthetic complications: no   No notable events documented.   Last Vitals:  Vitals:   07/04/23 0011 07/04/23 0813  BP: 130/85 128/82  Pulse:  75  Resp: 16 18  Temp: 36.6 C 37.1 C  SpO2: 97% 99%    Last Pain:  Vitals:   07/04/23 0820  TempSrc:   PainSc: 1                  Corinda Gubler

## 2023-07-04 NOTE — Progress Notes (Signed)
Discharge instructions reviewed with patient and her mother.  Printed copies given to patient for reference after discharge home.  Questions answered and follow up care reviewed.  Patient wheeled to medical mall entrance for discharge home.

## 2023-07-04 NOTE — Discharge Instructions (Signed)
Call your provider office to schedule a 6 weeks postpartum visit.  If you have questions or concerns you should call your provider office on the on call provider if after hours.  If you have urgent concerns please go to the nearest emergency department for evaluation.

## 2023-07-05 ENCOUNTER — Ambulatory Visit: Payer: Medicaid Other

## 2023-07-08 ENCOUNTER — Telehealth: Payer: Self-pay

## 2023-11-18 IMAGING — US US MFM OB DETAIL+14 WK
1 series · 13 of 28 positions shown · non-contrast
Comparison: none

[Series 1: us mfm ob detail+14 wk · 77 acquisitions, 13 frames shown]
[im 3/77]
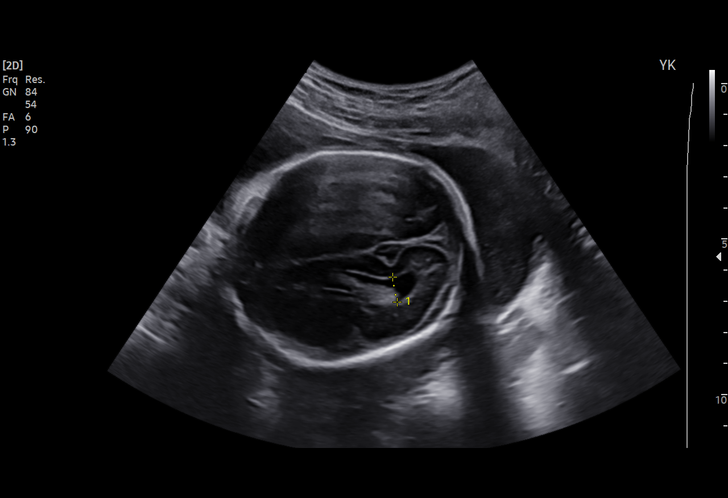
[im 9/77]
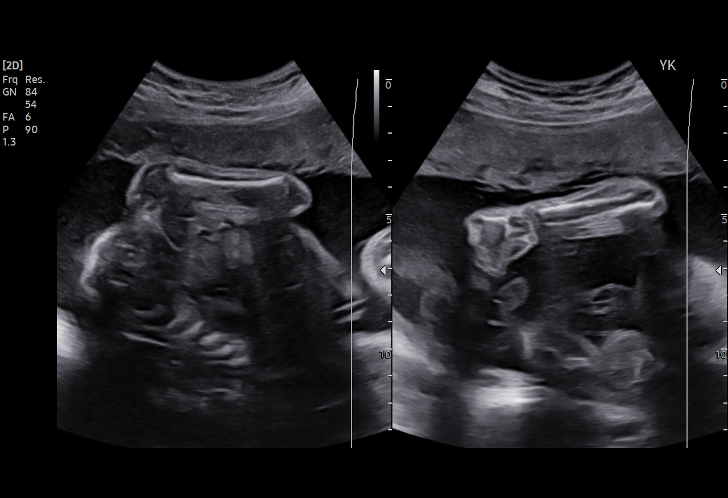
[im 15/77]
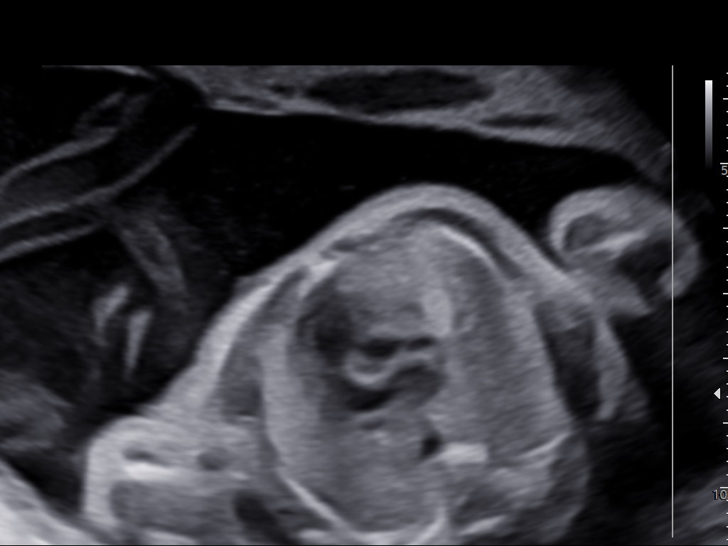
[im 20/77]
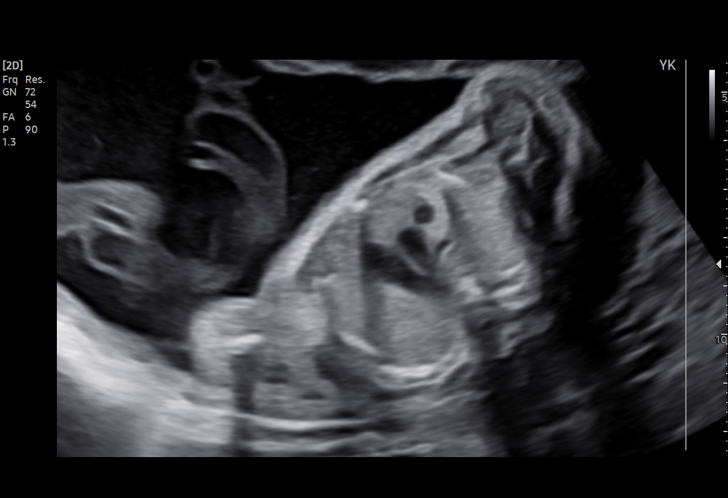
[im 26/77]
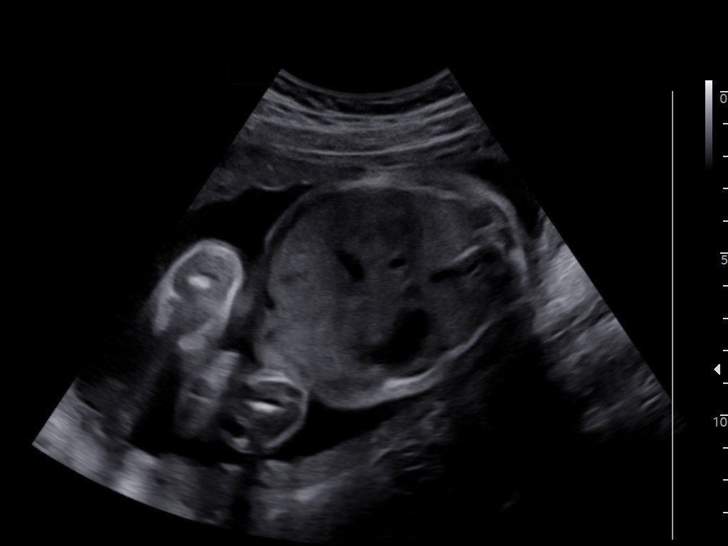
[im 31/77]
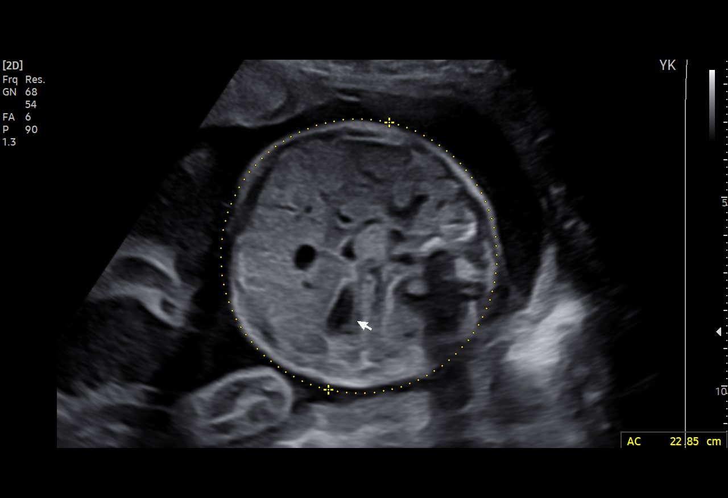
[im 40/77]
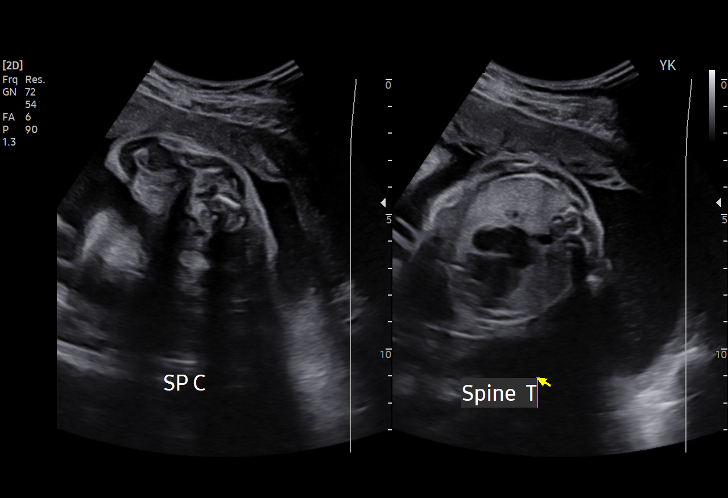
[im 46/77]
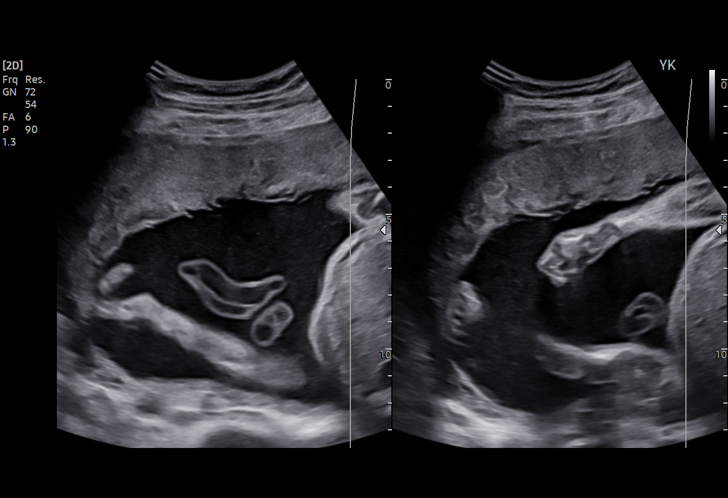
[im 51/77]
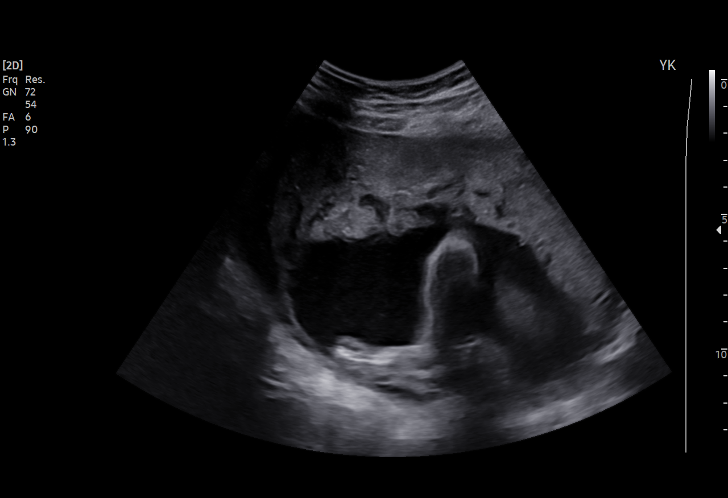
[im 57/77]
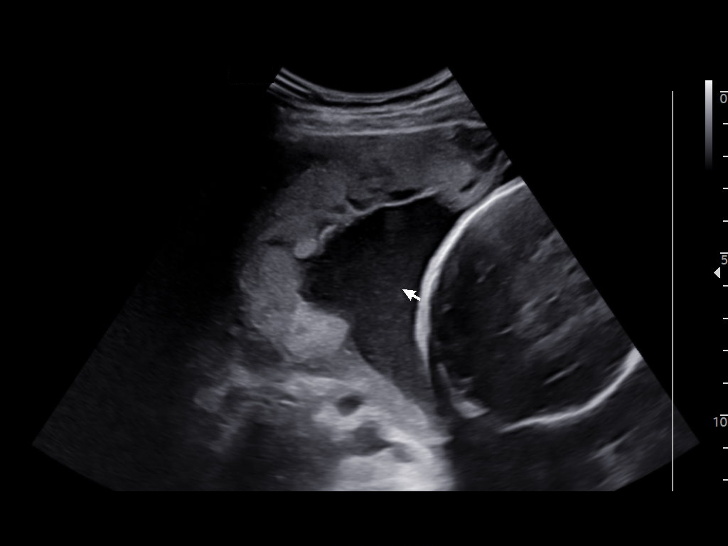
[im 62/77]
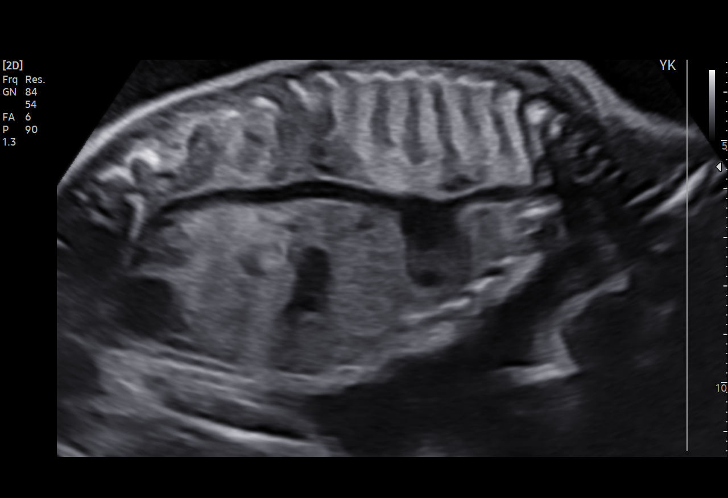
[im 68/77]
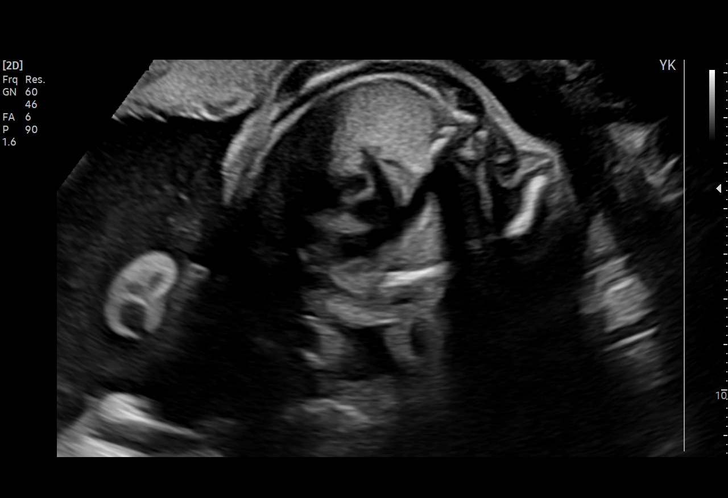
[im 74/77]
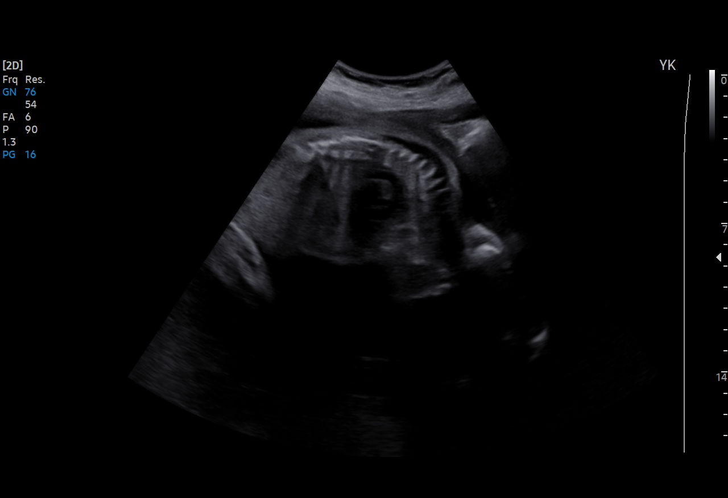

[13 of 28 positions shown; findings below may reference images not displayed]

[REDACTED]
                   DOSS CNM

Indications

 Substance abuse affecting pregnancy,
 antepartum
 Tobacco use complicating pregnancy,
 second trimester
 Circumvellate placenta
 26 weeks gestation of pregnancy

Fetal Evaluation

 Num Of Fetuses:         1
 Fetal Heart Rate(bpm):  125
 Cardiac Activity:       Observed
 Presentation:           Cephalic
 Placenta:               Mild Circumvallate placenta
 P. Cord Insertion:      Visualized, central

 Amniotic Fluid
 AFI FV:      Subjectively upper-normal

                             Largest Pocket(cm)

Biometry

 BPD:      68.6  mm     G. Age:  27w 4d         74  %    CI:        77.26   %    70 - 86
                                                         FL/HC:      18.7   %    18.6 -
 HC:      247.1  mm     G. Age:  26w 6d         31  %    HC/AC:      1.11        1.05 -
 AC:      223.5  mm     G. Age:  26w 5d         48  %    FL/BPD:     67.2   %    71 - 87
 FL:       46.1  mm     G. Age:  25w 2d          8  %    FL/AC:      20.6   %    20 - 24
 CER:      31.9  mm     G. Age:  27w 3d         82  %
 CM:          7  mm

 Est. FW:     919  gm           2 lb     28  %
Gestational Age

 U/S Today:     26w 4d                                        EDD:   06/21/22
 Best:          26w 4d     Det. By:  U/S (03/19/22)           EDD:   06/21/22
Anatomy

 Cranium:               Appears normal         Aortic Arch:            Appears normal
 Cavum:                 Appears normal         Ductal Arch:            Not well visualized
 Ventricles:            Appears normal         Diaphragm:              Appears normal
 Choroid Plexus:        Appears normal         Stomach:                Appears normal, left
                                                                       sided
 Cerebellum:            Appears normal         Abdomen:                Appears normal
 Posterior Fossa:       Appears normal         Abdominal Wall:         Appears nml (cord
                                                                       insert, abd wall)
 Nuchal Fold:           Not applicable (>20    Cord Vessels:           Appears normal (3
                        wks GA)                                        vessel cord)
 Face:                  Appears normal         Kidneys:                Appear normal
                        (orbits and profile)
 Lips:                  Appears normal         Bladder:                Appears normal
 Thoracic:              Appears normal         Spine:                  Appears normal
 Heart:                 Appears normal         Upper Extremities:      Appears normal
                        (4CH, axis, and
                        situs)
 RVOT:                  Appears normal         Lower Extremities:      Appears normal
 LVOT:                  Appears normal

 Other:  Fetus appears to be a male. Hands and feet visualized. Nasal bone
         visualized. VC, 3VV and 3VTV visualized.
Cervix Uterus Adnexa

 Cervix
 Not visualized (advanced GA >04wks)

 Uterus
 No abnormality visualized.

 Right Ovary
 Within normal limits.

 Left Ovary
 Within normal limits.

 Cul De Sac
 No free fluid seen.

 Adnexa
 No abnormality visualized.
Impression

 Single intrauterine pregnancy here for a detailed anatomy
 due to THC and tobacco exposure
 Normal anatomy with measurements consistent with dates
 There is good fetal movement and amniotic fluid volume
 Suboptimal views of the fetal anatomy were obtained
 secondary to fetal position.
 Circuvellate placenta was poorly visualized today
Recommendations

 Follow up growth and anatomy in 4 weeks.
